# Patient Record
Sex: Female | Born: 1965 | Race: White | Hispanic: No | Marital: Single | Smoking: Former smoker
Health system: Southern US, Community
[De-identification: ages and names within clinical notes are randomized; demographics above are authoritative.]

## PROBLEM LIST (undated history)

## (undated) DIAGNOSIS — F32A Depression, unspecified: Secondary | ICD-10-CM

## (undated) DIAGNOSIS — E785 Hyperlipidemia, unspecified: Secondary | ICD-10-CM

## (undated) DIAGNOSIS — K219 Gastro-esophageal reflux disease without esophagitis: Secondary | ICD-10-CM

## (undated) DIAGNOSIS — I10 Essential (primary) hypertension: Secondary | ICD-10-CM

## (undated) DIAGNOSIS — F329 Major depressive disorder, single episode, unspecified: Secondary | ICD-10-CM

## (undated) DIAGNOSIS — E119 Type 2 diabetes mellitus without complications: Secondary | ICD-10-CM

## (undated) DIAGNOSIS — G4730 Sleep apnea, unspecified: Secondary | ICD-10-CM

## (undated) DIAGNOSIS — F419 Anxiety disorder, unspecified: Secondary | ICD-10-CM

## (undated) DIAGNOSIS — N3040 Irradiation cystitis without hematuria: Secondary | ICD-10-CM

## (undated) HISTORY — DX: Gastro-esophageal reflux disease without esophagitis: K21.9

## (undated) HISTORY — DX: Major depressive disorder, single episode, unspecified: F32.9

## (undated) HISTORY — PX: BACK SURGERY: SHX140

## (undated) HISTORY — DX: Anxiety disorder, unspecified: F41.9

## (undated) HISTORY — PX: GALLBLADDER SURGERY: SHX652

## (undated) HISTORY — PX: APPENDECTOMY: SHX54

## (undated) HISTORY — DX: Type 2 diabetes mellitus without complications: E11.9

## (undated) HISTORY — DX: Irradiation cystitis without hematuria: N30.40

## (undated) HISTORY — DX: Hyperlipidemia, unspecified: E78.5

## (undated) HISTORY — PX: UMBILICAL HERNIA REPAIR: SHX196

## (undated) HISTORY — DX: Depression, unspecified: F32.A

## (undated) HISTORY — DX: Sleep apnea, unspecified: G47.30

## (undated) HISTORY — DX: Essential (primary) hypertension: I10

---

## 2006-04-05 ENCOUNTER — Ambulatory Visit (HOSPITAL_COMMUNITY): Admission: RE | Admit: 2006-04-05 | Discharge: 2006-04-05 | Payer: Self-pay | Admitting: Obstetrics & Gynecology

## 2006-05-17 ENCOUNTER — Ambulatory Visit: Admission: RE | Admit: 2006-05-17 | Discharge: 2006-05-17 | Payer: Self-pay | Admitting: Internal Medicine

## 2006-05-18 ENCOUNTER — Ambulatory Visit (HOSPITAL_COMMUNITY): Admission: RE | Admit: 2006-05-18 | Discharge: 2006-05-18 | Payer: Self-pay | Admitting: Internal Medicine

## 2006-05-20 ENCOUNTER — Inpatient Hospital Stay (HOSPITAL_COMMUNITY): Admission: AD | Admit: 2006-05-20 | Discharge: 2006-05-28 | Payer: Self-pay | Admitting: Otolaryngology

## 2006-05-25 ENCOUNTER — Ambulatory Visit: Payer: Self-pay | Admitting: Pulmonary Disease

## 2006-05-26 ENCOUNTER — Ambulatory Visit: Payer: Self-pay | Admitting: Internal Medicine

## 2006-08-27 ENCOUNTER — Ambulatory Visit (HOSPITAL_COMMUNITY): Admission: RE | Admit: 2006-08-27 | Discharge: 2006-08-27 | Payer: Self-pay | Admitting: *Deleted

## 2006-08-31 ENCOUNTER — Ambulatory Visit (HOSPITAL_COMMUNITY): Admission: RE | Admit: 2006-08-31 | Discharge: 2006-08-31 | Payer: Self-pay | Admitting: *Deleted

## 2006-09-28 ENCOUNTER — Ambulatory Visit (HOSPITAL_COMMUNITY): Admission: RE | Admit: 2006-09-28 | Discharge: 2006-09-28 | Payer: Self-pay | Admitting: Family Medicine

## 2006-10-11 ENCOUNTER — Ambulatory Visit (HOSPITAL_COMMUNITY): Admission: RE | Admit: 2006-10-11 | Discharge: 2006-10-11 | Payer: Self-pay | Admitting: Internal Medicine

## 2006-10-11 ENCOUNTER — Ambulatory Visit: Payer: Self-pay | Admitting: Internal Medicine

## 2010-09-02 NOTE — Cardiovascular Report (Signed)
NAME:  Alyssa Gay, Alyssa Gay               ACCOUNT NO.:  [id number]   MEDICAL RECORD NO.:  [id number]          PATIENT TYPE:  OIB   LOCATION:  2856                         FACILITY:  [facility]   PHYSICIAN:  Darlin Priestly, MD  DATE OF BIRTH:  12-Sep-1965   DATE OF PROCEDURE:  08/31/2006  DATE OF DISCHARGE:                            CARDIAC CATHETERIZATION   PROCEDURES:  1. Right heart catheterization.  2. Left heart catheterization.  3. Coronary angiography.  4. Left ventriculogram.  5. Abdominal aortogram.   COMPLICATIONS:  None.   INDICATIONS:  Ms. Alyssa Gay is a 45 year old female patient of Dr. Catalina Pizza and Dr. Kem Boroughs with a history of gastroesophageal reflux  disease, depression, history of recent hospital admission for MRSA  infection of the left face who has continued to complain of chest pain  and shortness of breath.  She has had negative Cardiolites as well as  ultrasounds revealing only mild MR. and mild TR.  Because of her ongoing  symptoms, she is now referred for cardiac catheterization to rule out  significant CAD.   DESCRIPTION OF OPERATION:  After obtaining informed consent, the patient  was brought to the cardiac cath lab.  The right groin was shaved,  prepped and draped in a sterile fashion.  EEG to monitor status.  Using  modified Seldinger technique, a #7-French venous sheath using the right  femoral vein and a #6-French arterial sheath using the right femoral  artery.  Next, under fluoroscopic guidance, a #7-French Swan-Ganz  catheter was floated into the RA, RV, PA and wedge position and  hemodynamics were obtained.   A #6-French diagnostic catheter was used to perform diagnostic  angiography.   The left main is a large vessel which has no significant disease.   The LAD is a medium-size vessel which becomes a small vessel toward the  apex to one small diagonal branch.  The LAD has no significant disease.   The first diagonal is a small vessel with  no significant disease.   The left coronary artery gives rise of a medium to large size ramus  intermedius which bifurcates distally with no significant disease.   Left circumflex is a medium-size vessel coursing in the AV groove and  gives rise to one obtuse marginal branch.  The AV circumflex has no  significant disease.   The first OM is a medium-size vessel which bifurcates distally with no  significant disease.   The right coronary is a large vessel which is dominant consists of a PDA  as well as posterolateral branch.  There is mild 30% ostial narrowing,  but no further significant disease in the RCA, PDA or posterolateral  branch.   Left ventriculogram reveals low normal ejection fraction of 50%.   Abdominal aortogram reveals no significant renal artery stenosis with  normal distal aorta.   HEMODYNAMICS:  Right arterial pressure 10, RV 34/7, PA 34/15, pulmonary  capillary pressures are 15, systemic arterial pressure 115/71, LV  systolic pressure 117/10, LVEDP of 17, cardiac output 6.9, cardiac index  3.3, PA saturation 61%, AO saturation 95%.   CONCLUSIONS:  1. No significant coronary artery disease.  2. Low normal ejection fraction.  3. No obvious renal artery stenosis.  4. Mild pulmonary hypertension.  5. Cardiac output of 6.9, cardiac index 3.3.  6. PA saturation 61%, AO saturation 95%.      Darlin Priestly, MD     RHM/MEDQ  D:  08/31/2006  T:  08/31/2006  Job:  [id number]   cc:   Dani Gobble, MD  Catalina Pizza, M.D.

## 2010-09-02 NOTE — Op Note (Signed)
NAME:  Alyssa Gay, Alyssa Gay               ACCOUNT NO.:  [id number]   MEDICAL RECORD NO.:  [id number]          PATIENT TYPE:  AMB   LOCATION:  DAY                           FACILITY:  [facility]   PHYSICIAN:  R. Roetta Sessions, M.D. DATE OF BIRTH:  03-03-1966   DATE OF PROCEDURE:  10/11/2006  DATE OF DISCHARGE:                               OPERATIVE REPORT   PROCEDURE:  Colonoscopy with snare polypectomy.   INDICATIONS FOR PROCEDURE:  45 year old lady with intermittent rectal  bleeding.  She denies constipation, diarrhea.  She denies any pain  associated with her symptoms. Father has history of bleeding colonic  polyps.  She seen Dr. Lazaro Arms recently and is not have felt be  GYN issue.  She has never had her lower GI tract imaged.  Colonoscopy is  now being done.  This approach has discussed the patient at length.  Potential risks, benefits, alternatives have been reviewed questions  answered, she is agreeable.  Please see documentation medical record.   PROCEDURE NOTE:  O2 saturation, blood pressure, pulse, respirations  monitored throughout entire procedure.   CONSCIOUS SEDATION:  Phenergan 12.5 mg diluted slow IV push to augment  conscious sedation IV Versed, Demerol incremental doses.   INSTRUMENT:  Pentax video chip system.   Digital rectal exam revealed some external hemorrhoidal tags, otherwise  negative.   ENDOSCOPIC FINDINGS:  Prep was suboptimal but doable.  Colon:  Colonic mucosa was surveyed from the rectosigmoid junction  through the left transverse right colon to area appendiceal orifice,  ileocecal valve and cecum.  These structures well seen photographed for  the record.  From this level scope was slowly withdrawn.  All previous  mentioned mucosal surfaces were again seen.  The patient had a flat 5-mm  polyp in the base of the cecum which was cold snared completely removed  with two passes.  The patient had a few scattered left-sided  diverticula.  Remainder colonic  mucosa appeared normal.  The scope was  pulled down to the rectum.  Thorough examination  rectal mucosa  including retroflexion view the anal verge demonstrated only some anal  canal hemorrhoids.  The patient tolerated the procedure well was reacted  in endoscopy.   IMPRESSION:  Anal canal hemorrhoids, otherwise normal rectum, scattered  left-sided diverticula, flat polyp in the base of cecum, cold snare  removed.  Remainder of colonic mucosa appeared normal.  This patient has  most likely bled from her anorectum via benign disease.   RECOMMENDATIONS:  Hemorrhoid and diverticulosis literature provided [name], a 10-day course of Anusol HC suppositories one per rectum  bedtime, daily Metamucil fiber supplement.  Follow-up on path (polyp  recovery process at time of dictation).  Further recommendations to  follow.      Jonathon Bellows, M.D.  Electronically Signed     RMR/MEDQ  D:  10/11/2006  T:  10/12/2006  Job:  [id number]   cc:   Angus G. Renard Matter, MD  Fax: [phone]   Lazaro Arms, M.D.  Fax: [phone]

## 2010-09-05 NOTE — Consult Note (Signed)
NAME:  Alyssa Gay, Alyssa Gay               ACCOUNT NO.:  [id number]   MEDICAL RECORD NO.:  [id number]          PATIENT TYPE:  INP   LOCATION:  6713                         FACILITY:  [facility]   PHYSICIAN:  Theresia Bough, MD       DATE OF BIRTH:  10/11/1965   DATE OF CONSULTATION:  05/22/2006  DATE OF DISCHARGE:                                 CONSULTATION   REQUESTING PHYSICIAN:  Lucky Cowboy, MD.   REASON FOR CONSULTATION:  Dr. Gerilyn Pilgrim has requested Family Practice to see  the patient.  Family Practice called [facility] to help them to see the  patient.  Reason for consult is to optimize patient's medical  management.   PRESENTING COMPLAINT:  Left facial pain and abscess.   HISTORY OF PRESENTING ILLNESS:  This is a 45 year old white female  patient who developed left facial pain and abscess.  She went to see Dr.  Gerilyn Pilgrim at [facility].  She was then admitted for  incision and drainage of the left facial abscess.  She was admitted to  the floor after the I&D.  Patient has been on the floor receiving pain  medications as well as antibiotics.  Patient said her pain is 7/10.  The  pain is relieved by morphine.  Patient denies any history of trauma.  She said that she first developed a skin pustule near the left side of  the mouth, which later developed to become an abscess.  She denies any  fever.  No nausea, no vomiting, no diarrhea.  No chest pain.  No cough.  No shortness of breath.  No abdominal pain.  She denies any dysuria.  No  leg swelling.  No feet swelling and no joint pains.   PAST MEDICAL HISTORY:  History of high cholesterol.  Patient takes  Vytoren.  Patient also told me that she had chest pain last week and she  went to see Dr. Domingo Sep.  She says she had a stress test done, but the  report is pending.  She also had a Holter placed on her.  The Holter was  to stay for 24 hours.  She disconnected the Holter herself while in the  hospital after 24 hours.  She returned  the Holter to Dr. Roque Lias  office.  She will follow up with Holter report and stress test results  after her discharge.  Other past medical history includes a history of  anxiety.  She takes Xanax for that.   SOCIAL HISTORY:  She used to be a smoker.  At this time she takes  Chantix.  She would like to stop smoking.  She lives with her boyfriend  and his two children.   ALLERGIES:  SHE HAS NO KNOWN DRUG ALLERGIES.   FAMILY HISTORY:  Family history is noncontributory.   CURRENT MEDICATIONS:  Includes:  1. Ciprofloxacin antibiotic.  2. She is on Xanax 4.5 mg nightly.  3. She is on Cymbalta 20 mg daily.  4. She is on Vytorin 1 tablet daily.  5. Protonix 40 mg daily.  6. She is on vancomycin 1 g  q.12 h.  7. She is on Percocet for pain.  8. She is also on morphine for pain.   PHYSICAL EXAMINATION:  VITAL SIGNS:  On physical examination,  temperature at 6:30 this morning was 100.1, pulse rate is 93,  respiratory rate 20, blood pressure is 112/65, oxygen saturation is 95%  on room air.  HEAD/NECK:  Shows pink conjunctivae.  She has swelling around the left  eye.  She also has swelling around the left cheek with redness.  The  left cheek is tender.  Because of the swelling, she has mouth slightly  deviated to the right side.  CHEST:  Chest wall motion to respiration.  Auscultation shows clear  breath sounds.  CARDIOVASCULAR:  Shows normal heart sounds.  No murmur.  No gallop.  ABDOMEN:  Abdomen is soft and nontender.  No masses palpable.  Patient  has normal bowel sounds.  EXTREMITIES:  Show no edema.  NEUROLOGIC SYSTEM:  Alert and oriented to time, place and person.  Her  speech is clear.  Power is 4/4 in all limbs.  Gait is normal.  Sensation  is also normal.   LABORATORY DATA:  Chest x-ray done this morning shows no acute disease.  Patient also has an abscess culture which is showing gram-positive cocci  in clusters.  Antibiotic sensitivity is pending.   ASSESSMENT:  1.  Cellulitis and left facial abscess.  Patient is on IV vancomycin 1      g q.12 h., which I will continue.  I will also follow up with the      sensitivity.  I will also follow up with antibiotic sensitivity as      well as organism identification.  We will modify her antibiotics      once the sensitivity is available.  2. History of high cholesterol.  I will continue Vytorin.  3. Also patient mentioned a history of chest pain before she came to      the hospital.  She does not have any chest pain at this time.  She      has done a stress test with Dr. Domingo Sep which she will follow up      as an outpatient.      Theresia Bough, MD  Electronically Signed     GA/MEDQ  D:  05/22/2006  T:  05/23/2006  Job:  [id number]

## 2010-09-05 NOTE — Op Note (Signed)
Alyssa Gay, Alyssa Gay               ACCOUNT NO.:  [id number]   MEDICAL RECORD NO.:  [id number]          PATIENT TYPE:  INP   LOCATION:  6713                         FACILITY:  [facility]   PHYSICIAN:  Lucky Cowboy, MD         DATE OF BIRTH:  Nov 19, 1965   DATE OF PROCEDURE:  05/21/2006  DATE OF DISCHARGE:                               OPERATIVE REPORT   PREOPERATIVE DIAGNOSIS:  Left facial abscess.   POSTOPERATIVE DIAGNOSIS:  Left facial abscess.   PROCEDURE:  Incision and drainage of left facial abscess.   SURGEON:  Lucky Cowboy, M.D.   ANESTHESIA:  General endotracheal anesthesia.   ESTIMATED BLOOD LOSS:  Less than 20 mL.   SPECIMENS:  Culture from abscess cavity.   COMPLICATIONS:  None.   INDICATIONS:  The patient is a 45 year old female with a two-week  history of progressive left facial swelling.  She reports that this  began as a very small pustule along the skin portion of the left upper  lateral lip.  Once the skin pustule began resolving, the left cheek over  the zygoma began swelling and became more and more painful.  There is  pain around the eye.  She was evaluated in the office yesterday and  noted to have an approximately 4-cm probable abscess pocket over the  left zygoma.  She did have a CT scan two days prior which indicated a  very small abscess cavity.  Due to progression despite antibiotic  therapy which has included Levaquin and clindamycin, the patient is  taken to the operating room for incision and drainage after admission  last night and placement on IV vancomycin.   FINDINGS:  The patient was noted to have approximately 5 to 7 mL of pus  in a left lateral facial abscess with maximum prominence over the  zygoma.  It was sent for culture and sensitivity, both aerobic and  anaerobic.   PROCEDURE:  The patient was taken to the operating room and placed on  the table in the supine position.  She was then placed under general  endotracheal anesthesia and the  left sublabial and gingival mucosa  injected with 1% lidocaine with 1:100,000 of epinephrine.  After  allowing time for vasoconstrictive effect, Bovie cautery was used in the  cutting mode to divide the mucosa and in the coagulation mode to divide  the underlying facial musculature.  A mosquito was then used to enter  the abscess cavity.  This was extended using a hemostat.  Pus was  evacuated promptly and cultures obtained.  The area was copiously  irrigated with normal saline.  A quarter-inch Penrose drain was placed  into the depths of the wound and secured to the gingiva in simple  interrupted fashion using 4-0 Vicryl.  The medial portion of the  incision was reapproximated in a simple interrupted fashion using 4-0  Vicryl.   The patient was awakened from anesthesia and taken to the postanesthesia  care unit in stable condition.  There were no complications.      Lucky Cowboy, MD  Electronically Signed  SJ/MEDQ  D:  05/21/2006  T:  05/21/2006  Job:  [id number]   cc:   Ladora Daniel, Nose, and Throat  Angus G. Renard Matter, MD

## 2010-09-05 NOTE — Procedures (Signed)
NAME:  Alyssa Gay, Alyssa Gay               ACCOUNT NO.:  [id number]   MEDICAL RECORD NO.:  [id number]          PATIENT TYPE:  INP   LOCATION:  SLEEP LAB                    FACILITY:  [facility]   PHYSICIAN:  Barbaraann Share, MD,FCCPDATE OF BIRTH:  1965-07-23   DATE OF STUDY:  05/20/2006                            NOCTURNAL POLYSOMNOGRAM   .   PLAN:   REFERRING PHYSICIAN:  Dr. Catalina Pizza.   DATE OF STUDY:  May 17, 2006.   INDICATION FOR STUDY:  Hypersomnia with sleep apnea.   EPWORTH SCORE:  16.   SLEEP ARCHITECTURE:  The patient had total sleep time of 330 minutes  with decreased slow wave sleep for her age and decreased REM as well at  20 minutes.  Sleep onset latency was prolonged at 44 minutes and REM  onset latency was prolonged at 210 minutes.  Sleep efficiency was  decreased at 85%.   RESPIRATORY DATA:  The patient was found to have 6 hypopneas and 1  central apnea for a respiratory disturbance index of 1.3 events per  hour.  Obviously, she did not meet split night protocol.  It should be  noted the patient had very little REM and therefore her degree of sleep  apnea may be underestimated.  The patient did have loud to very loud  snoring with very large numbers of nonspecific arousals.  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   OXYGEN DATA:  There was O2 desaturation as low as 90% during the study.   CARDIAC DATA:  No clinically significant cardiac arrhythmias.   MOVEMENT-PARASOMNIA:  The patient was found to have 46 leg jerks with 2  per hour resulting in arousal or awakening.   IMPRESSION-RECOMMENDATIONS:  1. Small numbers of obstructive events which do not meet the RDI      criteria for the obstructive sleep apnea syndrome.  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  This      is a pre sleep apnea condition that can often disrupt sleep and      cause symptoms during the day.  Clinical correlation is suggested.      The patient did not meet split night protocol secondary to her      small numbers of events.  2. Small numbers of leg jerks with mild sleep disruption.  This may      simply be due to the patient's sleep disordered breathing noted      above.  However, clinical correlation is suggested to make sure      there is not evidence for a primary movement disorder of sleep.      Barbaraann Share, MD,FCCP  Diplomate, American Board of Sleep  Medicine  Electronically Signed     KMC/MEDQ  D:  05/26/2006 15:03:02  T:  05/26/2006 22:37:09  Job:  [id number]

## 2010-09-05 NOTE — Discharge Summary (Signed)
NAMETALAYLA, DOYEL               ACCOUNT NO.:  [id number]   MEDICAL RECORD NO.:  [id number]          PATIENT TYPE:  INP   LOCATION:  6713                         FACILITY:  [facility]   PHYSICIAN:  Lucky Cowboy, MD         DATE OF BIRTH:  May 22, 1965   DATE OF ADMISSION:  05/20/2006  DATE OF DISCHARGE:  05/28/2006                               DISCHARGE SUMMARY   DISCHARGE DIAGNOSES:  1. Left methicillin-resistant staphylococcus aureus cheek abscess.  2. Hypercholesterolemia.   PROCEDURE PERFORMED:  Incision and drainage of left facial abscess.   HISTORY:  This patient is a 45 year old female who was seen yesterday in  the office.  She was noted to have a very swollen and sensitive left  face with erythema.  She was brought into the hospital for intravenous  antibiotic fluids and drainage of a left cheek abscess.  She was  admitted for intravenous vancomycin.  She was treated with Hibiclens  skin cleansing to prevent transmission of MRSA.  The surgery was  performed the following morning and did evacuate some abscess from a  large left cheek cavity.  Cultures were obtained and were reported back  as consistent with methicillin-resistant staphylococcus aureus.  Infectious Disease did see the patient which agreed with her diagnosis  and the fact that she was responding well to incision and drainage as  well as vancomycin.  Pharmacy regulated the vancomycin to appropriate  levels.  Percutaneous intravenous catheter was placed without difficulty  for home intravenous therapy which was arranged through [facility] home  care.  The patient did have some concerns with regard to pain  management.  This was somewhat difficult to control.  While in the  hospital, she did take Chantix for smoking cessation.  She was evaluated  for chest pain by [facility] and found to be without cardiac  etiology.  For these reasons, the patient was discharged to home on  May 28, 2006.  She has a follow-up in  the office in two days.      Lucky Cowboy, MD  Electronically Signed     SJ/MEDQ  D:  07/30/2006  T:  07/31/2006  Job:  [id number]

## 2011-09-18 ENCOUNTER — Encounter: Payer: Self-pay | Admitting: Internal Medicine

## 2015-08-01 DIAGNOSIS — G4700 Insomnia, unspecified: Secondary | ICD-10-CM | POA: Diagnosis not present

## 2015-08-01 DIAGNOSIS — R69 Illness, unspecified: Secondary | ICD-10-CM | POA: Diagnosis not present

## 2015-08-01 DIAGNOSIS — E1165 Type 2 diabetes mellitus with hyperglycemia: Secondary | ICD-10-CM | POA: Diagnosis not present

## 2015-08-01 DIAGNOSIS — R1084 Generalized abdominal pain: Secondary | ICD-10-CM | POA: Diagnosis not present

## 2015-09-10 DIAGNOSIS — R1030 Lower abdominal pain, unspecified: Secondary | ICD-10-CM | POA: Diagnosis not present

## 2015-09-10 DIAGNOSIS — R0789 Other chest pain: Secondary | ICD-10-CM | POA: Diagnosis not present

## 2015-09-10 DIAGNOSIS — Z5321 Procedure and treatment not carried out due to patient leaving prior to being seen by health care provider: Secondary | ICD-10-CM | POA: Diagnosis not present

## 2015-09-10 DIAGNOSIS — R1032 Left lower quadrant pain: Secondary | ICD-10-CM | POA: Diagnosis not present

## 2015-09-10 DIAGNOSIS — E785 Hyperlipidemia, unspecified: Secondary | ICD-10-CM | POA: Diagnosis not present

## 2015-09-10 DIAGNOSIS — E119 Type 2 diabetes mellitus without complications: Secondary | ICD-10-CM | POA: Diagnosis not present

## 2015-09-10 DIAGNOSIS — Z8543 Personal history of malignant neoplasm of ovary: Secondary | ICD-10-CM | POA: Diagnosis not present

## 2015-09-10 DIAGNOSIS — R69 Illness, unspecified: Secondary | ICD-10-CM | POA: Diagnosis not present

## 2015-09-10 DIAGNOSIS — Z79891 Long term (current) use of opiate analgesic: Secondary | ICD-10-CM | POA: Diagnosis not present

## 2015-09-10 DIAGNOSIS — Z7902 Long term (current) use of antithrombotics/antiplatelets: Secondary | ICD-10-CM | POA: Diagnosis not present

## 2015-09-10 DIAGNOSIS — Y9389 Activity, other specified: Secondary | ICD-10-CM | POA: Diagnosis not present

## 2015-09-10 DIAGNOSIS — R079 Chest pain, unspecified: Secondary | ICD-10-CM | POA: Diagnosis not present

## 2015-09-10 DIAGNOSIS — R238 Other skin changes: Secondary | ICD-10-CM | POA: Diagnosis not present

## 2015-09-10 DIAGNOSIS — S30821A Blister (nonthermal) of abdominal wall, initial encounter: Secondary | ICD-10-CM | POA: Diagnosis not present

## 2015-09-12 DIAGNOSIS — K439 Ventral hernia without obstruction or gangrene: Secondary | ICD-10-CM | POA: Diagnosis not present

## 2015-09-12 DIAGNOSIS — Y831 Surgical operation with implant of artificial internal device as the cause of abnormal reaction of the patient, or of later complication, without mention of misadventure at the time of the procedure: Secondary | ICD-10-CM | POA: Diagnosis not present

## 2015-09-12 DIAGNOSIS — T8579XA Infection and inflammatory reaction due to other internal prosthetic devices, implants and grafts, initial encounter: Secondary | ICD-10-CM | POA: Diagnosis not present

## 2015-09-12 DIAGNOSIS — R69 Illness, unspecified: Secondary | ICD-10-CM | POA: Diagnosis not present

## 2015-09-12 DIAGNOSIS — R1032 Left lower quadrant pain: Secondary | ICD-10-CM | POA: Diagnosis not present

## 2015-09-12 DIAGNOSIS — R233 Spontaneous ecchymoses: Secondary | ICD-10-CM | POA: Diagnosis not present

## 2015-09-12 DIAGNOSIS — R112 Nausea with vomiting, unspecified: Secondary | ICD-10-CM | POA: Diagnosis not present

## 2015-09-12 DIAGNOSIS — D72829 Elevated white blood cell count, unspecified: Secondary | ICD-10-CM | POA: Diagnosis not present

## 2015-09-12 DIAGNOSIS — Z7984 Long term (current) use of oral hypoglycemic drugs: Secondary | ICD-10-CM | POA: Diagnosis not present

## 2015-09-12 DIAGNOSIS — T814XXA Infection following a procedure, initial encounter: Secondary | ICD-10-CM | POA: Diagnosis not present

## 2015-09-13 DIAGNOSIS — D72829 Elevated white blood cell count, unspecified: Secondary | ICD-10-CM | POA: Diagnosis not present

## 2015-09-13 DIAGNOSIS — R1084 Generalized abdominal pain: Secondary | ICD-10-CM | POA: Diagnosis not present

## 2015-09-13 DIAGNOSIS — R69 Illness, unspecified: Secondary | ICD-10-CM | POA: Diagnosis not present

## 2015-09-13 DIAGNOSIS — T8579XA Infection and inflammatory reaction due to other internal prosthetic devices, implants and grafts, initial encounter: Secondary | ICD-10-CM | POA: Diagnosis not present

## 2015-09-13 DIAGNOSIS — Y831 Surgical operation with implant of artificial internal device as the cause of abnormal reaction of the patient, or of later complication, without mention of misadventure at the time of the procedure: Secondary | ICD-10-CM | POA: Diagnosis not present

## 2015-09-13 DIAGNOSIS — Z7984 Long term (current) use of oral hypoglycemic drugs: Secondary | ICD-10-CM | POA: Diagnosis not present

## 2015-09-25 DIAGNOSIS — K432 Incisional hernia without obstruction or gangrene: Secondary | ICD-10-CM | POA: Diagnosis not present

## 2015-09-25 DIAGNOSIS — M25473 Effusion, unspecified ankle: Secondary | ICD-10-CM | POA: Diagnosis not present

## 2015-10-07 DIAGNOSIS — M25473 Effusion, unspecified ankle: Secondary | ICD-10-CM | POA: Diagnosis not present

## 2015-10-07 DIAGNOSIS — Y838 Other surgical procedures as the cause of abnormal reaction of the patient, or of later complication, without mention of misadventure at the time of the procedure: Secondary | ICD-10-CM | POA: Diagnosis not present

## 2015-10-07 DIAGNOSIS — I340 Nonrheumatic mitral (valve) insufficiency: Secondary | ICD-10-CM | POA: Diagnosis not present

## 2015-10-07 DIAGNOSIS — Z79899 Other long term (current) drug therapy: Secondary | ICD-10-CM | POA: Diagnosis not present

## 2015-10-07 DIAGNOSIS — E119 Type 2 diabetes mellitus without complications: Secondary | ICD-10-CM | POA: Diagnosis not present

## 2015-10-07 DIAGNOSIS — R69 Illness, unspecified: Secondary | ICD-10-CM | POA: Diagnosis not present

## 2015-10-07 DIAGNOSIS — E785 Hyperlipidemia, unspecified: Secondary | ICD-10-CM | POA: Diagnosis not present

## 2015-10-07 DIAGNOSIS — G2581 Restless legs syndrome: Secondary | ICD-10-CM | POA: Diagnosis not present

## 2015-10-07 DIAGNOSIS — T8579XA Infection and inflammatory reaction due to other internal prosthetic devices, implants and grafts, initial encounter: Secondary | ICD-10-CM | POA: Diagnosis not present

## 2015-10-15 DIAGNOSIS — G2581 Restless legs syndrome: Secondary | ICD-10-CM | POA: Diagnosis not present

## 2015-10-15 DIAGNOSIS — G8918 Other acute postprocedural pain: Secondary | ICD-10-CM | POA: Diagnosis not present

## 2015-10-15 DIAGNOSIS — T8579XA Infection and inflammatory reaction due to other internal prosthetic devices, implants and grafts, initial encounter: Secondary | ICD-10-CM | POA: Diagnosis not present

## 2015-10-15 DIAGNOSIS — R109 Unspecified abdominal pain: Secondary | ICD-10-CM | POA: Diagnosis not present

## 2015-10-15 DIAGNOSIS — E785 Hyperlipidemia, unspecified: Secondary | ICD-10-CM | POA: Diagnosis not present

## 2015-10-15 DIAGNOSIS — R69 Illness, unspecified: Secondary | ICD-10-CM | POA: Diagnosis not present

## 2015-10-15 DIAGNOSIS — E119 Type 2 diabetes mellitus without complications: Secondary | ICD-10-CM | POA: Diagnosis not present

## 2015-10-15 DIAGNOSIS — K432 Incisional hernia without obstruction or gangrene: Secondary | ICD-10-CM | POA: Diagnosis not present

## 2015-10-15 DIAGNOSIS — R339 Retention of urine, unspecified: Secondary | ICD-10-CM | POA: Diagnosis not present

## 2015-10-15 DIAGNOSIS — G8929 Other chronic pain: Secondary | ICD-10-CM | POA: Diagnosis not present

## 2015-10-30 DIAGNOSIS — Z09 Encounter for follow-up examination after completed treatment for conditions other than malignant neoplasm: Secondary | ICD-10-CM | POA: Diagnosis not present

## 2015-11-06 DIAGNOSIS — Y838 Other surgical procedures as the cause of abnormal reaction of the patient, or of later complication, without mention of misadventure at the time of the procedure: Secondary | ICD-10-CM | POA: Diagnosis not present

## 2015-11-06 DIAGNOSIS — T814XXD Infection following a procedure, subsequent encounter: Secondary | ICD-10-CM | POA: Diagnosis not present

## 2015-11-12 DIAGNOSIS — R69 Illness, unspecified: Secondary | ICD-10-CM | POA: Diagnosis not present

## 2015-11-12 DIAGNOSIS — R600 Localized edema: Secondary | ICD-10-CM | POA: Diagnosis not present

## 2015-11-12 DIAGNOSIS — E1165 Type 2 diabetes mellitus with hyperglycemia: Secondary | ICD-10-CM | POA: Diagnosis not present

## 2015-11-12 DIAGNOSIS — R1084 Generalized abdominal pain: Secondary | ICD-10-CM | POA: Diagnosis not present

## 2015-11-20 DIAGNOSIS — L539 Erythematous condition, unspecified: Secondary | ICD-10-CM | POA: Diagnosis not present

## 2015-11-20 DIAGNOSIS — R188 Other ascites: Secondary | ICD-10-CM | POA: Diagnosis not present

## 2015-11-22 DIAGNOSIS — L03311 Cellulitis of abdominal wall: Secondary | ICD-10-CM | POA: Diagnosis not present

## 2015-11-22 DIAGNOSIS — E119 Type 2 diabetes mellitus without complications: Secondary | ICD-10-CM | POA: Diagnosis not present

## 2015-11-22 DIAGNOSIS — E785 Hyperlipidemia, unspecified: Secondary | ICD-10-CM | POA: Diagnosis not present

## 2015-11-22 DIAGNOSIS — R69 Illness, unspecified: Secondary | ICD-10-CM | POA: Diagnosis not present

## 2015-11-22 DIAGNOSIS — R109 Unspecified abdominal pain: Secondary | ICD-10-CM | POA: Diagnosis not present

## 2015-11-22 DIAGNOSIS — Y832 Surgical operation with anastomosis, bypass or graft as the cause of abnormal reaction of the patient, or of later complication, without mention of misadventure at the time of the procedure: Secondary | ICD-10-CM | POA: Diagnosis not present

## 2015-11-22 DIAGNOSIS — Z90710 Acquired absence of both cervix and uterus: Secondary | ICD-10-CM | POA: Diagnosis not present

## 2015-11-22 DIAGNOSIS — G2581 Restless legs syndrome: Secondary | ICD-10-CM | POA: Diagnosis not present

## 2015-11-22 DIAGNOSIS — T8579XA Infection and inflammatory reaction due to other internal prosthetic devices, implants and grafts, initial encounter: Secondary | ICD-10-CM | POA: Diagnosis not present

## 2015-12-12 DIAGNOSIS — Z803 Family history of malignant neoplasm of breast: Secondary | ICD-10-CM | POA: Diagnosis not present

## 2015-12-12 DIAGNOSIS — L680 Hirsutism: Secondary | ICD-10-CM | POA: Diagnosis not present

## 2015-12-12 DIAGNOSIS — R110 Nausea: Secondary | ICD-10-CM | POA: Diagnosis not present

## 2015-12-12 DIAGNOSIS — E8940 Asymptomatic postprocedural ovarian failure: Secondary | ICD-10-CM | POA: Diagnosis not present

## 2015-12-18 DIAGNOSIS — R229 Localized swelling, mass and lump, unspecified: Secondary | ICD-10-CM | POA: Diagnosis not present

## 2015-12-18 DIAGNOSIS — N6459 Other signs and symptoms in breast: Secondary | ICD-10-CM | POA: Diagnosis not present

## 2015-12-18 DIAGNOSIS — R2231 Localized swelling, mass and lump, right upper limb: Secondary | ICD-10-CM | POA: Diagnosis not present

## 2015-12-18 DIAGNOSIS — N63 Unspecified lump in breast: Secondary | ICD-10-CM | POA: Diagnosis not present

## 2015-12-25 DIAGNOSIS — Z79899 Other long term (current) drug therapy: Secondary | ICD-10-CM | POA: Diagnosis not present

## 2015-12-25 DIAGNOSIS — E785 Hyperlipidemia, unspecified: Secondary | ICD-10-CM | POA: Diagnosis not present

## 2015-12-25 DIAGNOSIS — T8579XA Infection and inflammatory reaction due to other internal prosthetic devices, implants and grafts, initial encounter: Secondary | ICD-10-CM | POA: Diagnosis not present

## 2015-12-25 DIAGNOSIS — Y33XXXA Other specified events, undetermined intent, initial encounter: Secondary | ICD-10-CM | POA: Diagnosis not present

## 2015-12-25 DIAGNOSIS — T8189XA Other complications of procedures, not elsewhere classified, initial encounter: Secondary | ICD-10-CM | POA: Diagnosis not present

## 2015-12-25 DIAGNOSIS — E119 Type 2 diabetes mellitus without complications: Secondary | ICD-10-CM | POA: Diagnosis not present

## 2015-12-25 DIAGNOSIS — T814XXA Infection following a procedure, initial encounter: Secondary | ICD-10-CM | POA: Diagnosis not present

## 2015-12-25 DIAGNOSIS — G2581 Restless legs syndrome: Secondary | ICD-10-CM | POA: Diagnosis not present

## 2015-12-25 DIAGNOSIS — Y838 Other surgical procedures as the cause of abnormal reaction of the patient, or of later complication, without mention of misadventure at the time of the procedure: Secondary | ICD-10-CM | POA: Diagnosis not present

## 2015-12-25 DIAGNOSIS — L7634 Postprocedural seroma of skin and subcutaneous tissue following other procedure: Secondary | ICD-10-CM | POA: Diagnosis not present

## 2015-12-25 DIAGNOSIS — Z7984 Long term (current) use of oral hypoglycemic drugs: Secondary | ICD-10-CM | POA: Diagnosis not present

## 2015-12-26 DIAGNOSIS — L7632 Postprocedural hematoma of skin and subcutaneous tissue following other procedure: Secondary | ICD-10-CM | POA: Diagnosis not present

## 2015-12-26 DIAGNOSIS — B9561 Methicillin susceptible Staphylococcus aureus infection as the cause of diseases classified elsewhere: Secondary | ICD-10-CM | POA: Diagnosis not present

## 2015-12-26 DIAGNOSIS — B957 Other staphylococcus as the cause of diseases classified elsewhere: Secondary | ICD-10-CM | POA: Diagnosis not present

## 2015-12-26 DIAGNOSIS — Z01818 Encounter for other preprocedural examination: Secondary | ICD-10-CM | POA: Diagnosis not present

## 2015-12-26 DIAGNOSIS — T8189XA Other complications of procedures, not elsewhere classified, initial encounter: Secondary | ICD-10-CM | POA: Diagnosis not present

## 2015-12-27 DIAGNOSIS — T8189XA Other complications of procedures, not elsewhere classified, initial encounter: Secondary | ICD-10-CM | POA: Diagnosis not present

## 2015-12-30 DIAGNOSIS — Z7984 Long term (current) use of oral hypoglycemic drugs: Secondary | ICD-10-CM | POA: Diagnosis not present

## 2015-12-30 DIAGNOSIS — T814XXA Infection following a procedure, initial encounter: Secondary | ICD-10-CM | POA: Diagnosis not present

## 2015-12-30 DIAGNOSIS — Z4801 Encounter for change or removal of surgical wound dressing: Secondary | ICD-10-CM | POA: Diagnosis not present

## 2015-12-30 DIAGNOSIS — E119 Type 2 diabetes mellitus without complications: Secondary | ICD-10-CM | POA: Diagnosis not present

## 2016-01-01 DIAGNOSIS — Z09 Encounter for follow-up examination after completed treatment for conditions other than malignant neoplasm: Secondary | ICD-10-CM | POA: Diagnosis not present

## 2016-01-03 DIAGNOSIS — Z7984 Long term (current) use of oral hypoglycemic drugs: Secondary | ICD-10-CM | POA: Diagnosis not present

## 2016-01-03 DIAGNOSIS — E119 Type 2 diabetes mellitus without complications: Secondary | ICD-10-CM | POA: Diagnosis not present

## 2016-01-03 DIAGNOSIS — Z4801 Encounter for change or removal of surgical wound dressing: Secondary | ICD-10-CM | POA: Diagnosis not present

## 2016-01-03 DIAGNOSIS — T814XXA Infection following a procedure, initial encounter: Secondary | ICD-10-CM | POA: Diagnosis not present

## 2016-01-06 DIAGNOSIS — Z4801 Encounter for change or removal of surgical wound dressing: Secondary | ICD-10-CM | POA: Diagnosis not present

## 2016-01-06 DIAGNOSIS — Z7984 Long term (current) use of oral hypoglycemic drugs: Secondary | ICD-10-CM | POA: Diagnosis not present

## 2016-01-06 DIAGNOSIS — T814XXA Infection following a procedure, initial encounter: Secondary | ICD-10-CM | POA: Diagnosis not present

## 2016-01-06 DIAGNOSIS — E119 Type 2 diabetes mellitus without complications: Secondary | ICD-10-CM | POA: Diagnosis not present

## 2016-01-08 DIAGNOSIS — T8131XD Disruption of external operation (surgical) wound, not elsewhere classified, subsequent encounter: Secondary | ICD-10-CM | POA: Diagnosis not present

## 2016-01-08 DIAGNOSIS — L0390 Cellulitis, unspecified: Secondary | ICD-10-CM | POA: Diagnosis not present

## 2016-01-08 DIAGNOSIS — E119 Type 2 diabetes mellitus without complications: Secondary | ICD-10-CM | POA: Diagnosis not present

## 2016-01-08 DIAGNOSIS — Z7984 Long term (current) use of oral hypoglycemic drugs: Secondary | ICD-10-CM | POA: Diagnosis not present

## 2016-01-08 DIAGNOSIS — Z4801 Encounter for change or removal of surgical wound dressing: Secondary | ICD-10-CM | POA: Diagnosis not present

## 2016-01-08 DIAGNOSIS — Y838 Other surgical procedures as the cause of abnormal reaction of the patient, or of later complication, without mention of misadventure at the time of the procedure: Secondary | ICD-10-CM | POA: Diagnosis not present

## 2016-01-08 DIAGNOSIS — T814XXA Infection following a procedure, initial encounter: Secondary | ICD-10-CM | POA: Diagnosis not present

## 2016-01-13 DIAGNOSIS — T814XXA Infection following a procedure, initial encounter: Secondary | ICD-10-CM | POA: Diagnosis not present

## 2016-01-13 DIAGNOSIS — T148 Other injury of unspecified body region: Secondary | ICD-10-CM | POA: Diagnosis not present

## 2016-01-13 DIAGNOSIS — Z7984 Long term (current) use of oral hypoglycemic drugs: Secondary | ICD-10-CM | POA: Diagnosis not present

## 2016-01-13 DIAGNOSIS — Z5181 Encounter for therapeutic drug level monitoring: Secondary | ICD-10-CM | POA: Diagnosis not present

## 2016-01-13 DIAGNOSIS — Z79899 Other long term (current) drug therapy: Secondary | ICD-10-CM | POA: Diagnosis not present

## 2016-01-22 DIAGNOSIS — Y838 Other surgical procedures as the cause of abnormal reaction of the patient, or of later complication, without mention of misadventure at the time of the procedure: Secondary | ICD-10-CM | POA: Diagnosis not present

## 2016-01-22 DIAGNOSIS — R69 Illness, unspecified: Secondary | ICD-10-CM | POA: Diagnosis not present

## 2016-01-22 DIAGNOSIS — L0889 Other specified local infections of the skin and subcutaneous tissue: Secondary | ICD-10-CM | POA: Diagnosis not present

## 2016-01-22 DIAGNOSIS — T8131XD Disruption of external operation (surgical) wound, not elsewhere classified, subsequent encounter: Secondary | ICD-10-CM | POA: Diagnosis not present

## 2016-01-22 DIAGNOSIS — L089 Local infection of the skin and subcutaneous tissue, unspecified: Secondary | ICD-10-CM | POA: Diagnosis not present

## 2016-01-22 DIAGNOSIS — S31109D Unspecified open wound of abdominal wall, unspecified quadrant without penetration into peritoneal cavity, subsequent encounter: Secondary | ICD-10-CM | POA: Diagnosis not present

## 2016-01-31 DIAGNOSIS — Z7984 Long term (current) use of oral hypoglycemic drugs: Secondary | ICD-10-CM | POA: Diagnosis not present

## 2016-01-31 DIAGNOSIS — T814XXA Infection following a procedure, initial encounter: Secondary | ICD-10-CM | POA: Diagnosis not present

## 2016-01-31 DIAGNOSIS — Z4801 Encounter for change or removal of surgical wound dressing: Secondary | ICD-10-CM | POA: Diagnosis not present

## 2016-01-31 DIAGNOSIS — E119 Type 2 diabetes mellitus without complications: Secondary | ICD-10-CM | POA: Diagnosis not present

## 2016-02-05 DIAGNOSIS — Z7984 Long term (current) use of oral hypoglycemic drugs: Secondary | ICD-10-CM | POA: Diagnosis not present

## 2016-02-05 DIAGNOSIS — Z4801 Encounter for change or removal of surgical wound dressing: Secondary | ICD-10-CM | POA: Diagnosis not present

## 2016-02-05 DIAGNOSIS — E119 Type 2 diabetes mellitus without complications: Secondary | ICD-10-CM | POA: Diagnosis not present

## 2016-02-05 DIAGNOSIS — T814XXA Infection following a procedure, initial encounter: Secondary | ICD-10-CM | POA: Diagnosis not present

## 2016-02-12 DIAGNOSIS — R69 Illness, unspecified: Secondary | ICD-10-CM | POA: Diagnosis not present

## 2016-02-12 DIAGNOSIS — X58XXXD Exposure to other specified factors, subsequent encounter: Secondary | ICD-10-CM | POA: Diagnosis not present

## 2016-02-12 DIAGNOSIS — T148XXD Other injury of unspecified body region, subsequent encounter: Secondary | ICD-10-CM | POA: Diagnosis not present

## 2016-02-12 DIAGNOSIS — Z7984 Long term (current) use of oral hypoglycemic drugs: Secondary | ICD-10-CM | POA: Diagnosis not present

## 2016-02-12 DIAGNOSIS — T814XXA Infection following a procedure, initial encounter: Secondary | ICD-10-CM | POA: Diagnosis not present

## 2016-02-12 DIAGNOSIS — T148XXA Other injury of unspecified body region, initial encounter: Secondary | ICD-10-CM | POA: Diagnosis not present

## 2016-02-12 DIAGNOSIS — R1011 Right upper quadrant pain: Secondary | ICD-10-CM | POA: Diagnosis not present

## 2016-02-12 DIAGNOSIS — Z4801 Encounter for change or removal of surgical wound dressing: Secondary | ICD-10-CM | POA: Diagnosis not present

## 2016-02-12 DIAGNOSIS — E119 Type 2 diabetes mellitus without complications: Secondary | ICD-10-CM | POA: Diagnosis not present

## 2016-02-17 DIAGNOSIS — Z7984 Long term (current) use of oral hypoglycemic drugs: Secondary | ICD-10-CM | POA: Diagnosis not present

## 2016-02-17 DIAGNOSIS — Z4801 Encounter for change or removal of surgical wound dressing: Secondary | ICD-10-CM | POA: Diagnosis not present

## 2016-02-17 DIAGNOSIS — E119 Type 2 diabetes mellitus without complications: Secondary | ICD-10-CM | POA: Diagnosis not present

## 2016-02-17 DIAGNOSIS — T814XXA Infection following a procedure, initial encounter: Secondary | ICD-10-CM | POA: Diagnosis not present

## 2016-02-19 DIAGNOSIS — R69 Illness, unspecified: Secondary | ICD-10-CM | POA: Diagnosis not present

## 2016-02-19 DIAGNOSIS — X58XXXA Exposure to other specified factors, initial encounter: Secondary | ICD-10-CM | POA: Diagnosis not present

## 2016-02-19 DIAGNOSIS — I96 Gangrene, not elsewhere classified: Secondary | ICD-10-CM | POA: Diagnosis not present

## 2016-02-19 DIAGNOSIS — T148XXA Other injury of unspecified body region, initial encounter: Secondary | ICD-10-CM | POA: Diagnosis not present

## 2016-02-19 DIAGNOSIS — K439 Ventral hernia without obstruction or gangrene: Secondary | ICD-10-CM | POA: Diagnosis not present

## 2016-02-19 DIAGNOSIS — S31109A Unspecified open wound of abdominal wall, unspecified quadrant without penetration into peritoneal cavity, initial encounter: Secondary | ICD-10-CM | POA: Diagnosis not present

## 2016-02-26 DIAGNOSIS — Z7984 Long term (current) use of oral hypoglycemic drugs: Secondary | ICD-10-CM | POA: Diagnosis not present

## 2016-02-26 DIAGNOSIS — E119 Type 2 diabetes mellitus without complications: Secondary | ICD-10-CM | POA: Diagnosis not present

## 2016-02-26 DIAGNOSIS — Z4801 Encounter for change or removal of surgical wound dressing: Secondary | ICD-10-CM | POA: Diagnosis not present

## 2016-02-26 DIAGNOSIS — T814XXA Infection following a procedure, initial encounter: Secondary | ICD-10-CM | POA: Diagnosis not present

## 2016-02-27 DIAGNOSIS — E119 Type 2 diabetes mellitus without complications: Secondary | ICD-10-CM | POA: Diagnosis not present

## 2016-02-27 DIAGNOSIS — Z4801 Encounter for change or removal of surgical wound dressing: Secondary | ICD-10-CM | POA: Diagnosis not present

## 2016-02-27 DIAGNOSIS — T814XXA Infection following a procedure, initial encounter: Secondary | ICD-10-CM | POA: Diagnosis not present

## 2016-02-27 DIAGNOSIS — Z7984 Long term (current) use of oral hypoglycemic drugs: Secondary | ICD-10-CM | POA: Diagnosis not present

## 2016-02-28 DIAGNOSIS — E119 Type 2 diabetes mellitus without complications: Secondary | ICD-10-CM | POA: Diagnosis not present

## 2016-02-28 DIAGNOSIS — T814XXA Infection following a procedure, initial encounter: Secondary | ICD-10-CM | POA: Diagnosis not present

## 2016-02-28 DIAGNOSIS — Z7984 Long term (current) use of oral hypoglycemic drugs: Secondary | ICD-10-CM | POA: Diagnosis not present

## 2016-03-04 DIAGNOSIS — X58XXXD Exposure to other specified factors, subsequent encounter: Secondary | ICD-10-CM | POA: Diagnosis not present

## 2016-03-04 DIAGNOSIS — R69 Illness, unspecified: Secondary | ICD-10-CM | POA: Diagnosis not present

## 2016-03-04 DIAGNOSIS — S31109D Unspecified open wound of abdominal wall, unspecified quadrant without penetration into peritoneal cavity, subsequent encounter: Secondary | ICD-10-CM | POA: Diagnosis not present

## 2016-03-17 DIAGNOSIS — R600 Localized edema: Secondary | ICD-10-CM | POA: Diagnosis not present

## 2016-03-17 DIAGNOSIS — R1084 Generalized abdominal pain: Secondary | ICD-10-CM | POA: Diagnosis not present

## 2016-03-17 DIAGNOSIS — R69 Illness, unspecified: Secondary | ICD-10-CM | POA: Diagnosis not present

## 2016-03-17 DIAGNOSIS — E010 Iodine-deficiency related diffuse (endemic) goiter: Secondary | ICD-10-CM | POA: Diagnosis not present

## 2016-03-17 DIAGNOSIS — E1165 Type 2 diabetes mellitus with hyperglycemia: Secondary | ICD-10-CM | POA: Diagnosis not present

## 2016-03-25 DIAGNOSIS — S31109D Unspecified open wound of abdominal wall, unspecified quadrant without penetration into peritoneal cavity, subsequent encounter: Secondary | ICD-10-CM | POA: Diagnosis not present

## 2016-03-25 DIAGNOSIS — X58XXXD Exposure to other specified factors, subsequent encounter: Secondary | ICD-10-CM | POA: Diagnosis not present

## 2016-04-03 DIAGNOSIS — S31609A Unspecified open wound of abdominal wall, unspecified quadrant with penetration into peritoneal cavity, initial encounter: Secondary | ICD-10-CM | POA: Diagnosis not present

## 2016-04-07 DIAGNOSIS — E010 Iodine-deficiency related diffuse (endemic) goiter: Secondary | ICD-10-CM | POA: Diagnosis not present

## 2016-04-22 DIAGNOSIS — T148XXA Other injury of unspecified body region, initial encounter: Secondary | ICD-10-CM | POA: Diagnosis not present

## 2016-04-22 DIAGNOSIS — X58XXXD Exposure to other specified factors, subsequent encounter: Secondary | ICD-10-CM | POA: Diagnosis not present

## 2016-04-22 DIAGNOSIS — R110 Nausea: Secondary | ICD-10-CM | POA: Diagnosis not present

## 2016-04-22 DIAGNOSIS — E042 Nontoxic multinodular goiter: Secondary | ICD-10-CM | POA: Diagnosis not present

## 2016-04-22 DIAGNOSIS — R69 Illness, unspecified: Secondary | ICD-10-CM | POA: Diagnosis not present

## 2016-04-22 DIAGNOSIS — T148XXD Other injury of unspecified body region, subsequent encounter: Secondary | ICD-10-CM | POA: Diagnosis not present

## 2016-04-23 DIAGNOSIS — R69 Illness, unspecified: Secondary | ICD-10-CM | POA: Diagnosis not present

## 2016-05-11 DIAGNOSIS — R69 Illness, unspecified: Secondary | ICD-10-CM | POA: Diagnosis not present

## 2016-05-19 DIAGNOSIS — R69 Illness, unspecified: Secondary | ICD-10-CM | POA: Diagnosis not present

## 2016-05-29 DIAGNOSIS — Z79891 Long term (current) use of opiate analgesic: Secondary | ICD-10-CM | POA: Diagnosis not present

## 2016-06-01 DIAGNOSIS — R69 Illness, unspecified: Secondary | ICD-10-CM | POA: Diagnosis not present

## 2016-06-08 DIAGNOSIS — R69 Illness, unspecified: Secondary | ICD-10-CM | POA: Diagnosis not present

## 2016-06-10 DIAGNOSIS — T148XXA Other injury of unspecified body region, initial encounter: Secondary | ICD-10-CM | POA: Diagnosis not present

## 2016-06-10 DIAGNOSIS — R110 Nausea: Secondary | ICD-10-CM | POA: Diagnosis not present

## 2016-06-10 DIAGNOSIS — R509 Fever, unspecified: Secondary | ICD-10-CM | POA: Diagnosis not present

## 2016-06-10 DIAGNOSIS — X58XXXD Exposure to other specified factors, subsequent encounter: Secondary | ICD-10-CM | POA: Diagnosis not present

## 2016-06-10 DIAGNOSIS — S31109D Unspecified open wound of abdominal wall, unspecified quadrant without penetration into peritoneal cavity, subsequent encounter: Secondary | ICD-10-CM | POA: Diagnosis not present

## 2016-08-17 DIAGNOSIS — L680 Hirsutism: Secondary | ICD-10-CM | POA: Diagnosis not present

## 2016-08-17 DIAGNOSIS — R87612 Low grade squamous intraepithelial lesion on cytologic smear of cervix (LGSIL): Secondary | ICD-10-CM | POA: Diagnosis not present

## 2016-08-17 DIAGNOSIS — Z87411 Personal history of vaginal dysplasia: Secondary | ICD-10-CM | POA: Diagnosis not present

## 2016-08-17 DIAGNOSIS — Z01419 Encounter for gynecological examination (general) (routine) without abnormal findings: Secondary | ICD-10-CM | POA: Diagnosis not present

## 2016-08-17 DIAGNOSIS — N951 Menopausal and female climacteric states: Secondary | ICD-10-CM | POA: Diagnosis not present

## 2016-08-17 DIAGNOSIS — Z1231 Encounter for screening mammogram for malignant neoplasm of breast: Secondary | ICD-10-CM | POA: Diagnosis not present

## 2016-08-26 DIAGNOSIS — X58XXXD Exposure to other specified factors, subsequent encounter: Secondary | ICD-10-CM | POA: Diagnosis not present

## 2016-08-26 DIAGNOSIS — R110 Nausea: Secondary | ICD-10-CM | POA: Diagnosis not present

## 2016-08-26 DIAGNOSIS — S31109D Unspecified open wound of abdominal wall, unspecified quadrant without penetration into peritoneal cavity, subsequent encounter: Secondary | ICD-10-CM | POA: Diagnosis not present

## 2016-09-08 DIAGNOSIS — R87622 Low grade squamous intraepithelial lesion on cytologic smear of vagina (LGSIL): Secondary | ICD-10-CM | POA: Diagnosis not present

## 2016-09-08 DIAGNOSIS — N870 Mild cervical dysplasia: Secondary | ICD-10-CM | POA: Diagnosis not present

## 2016-09-08 DIAGNOSIS — Z87411 Personal history of vaginal dysplasia: Secondary | ICD-10-CM | POA: Diagnosis not present

## 2016-09-08 DIAGNOSIS — R350 Frequency of micturition: Secondary | ICD-10-CM | POA: Diagnosis not present

## 2016-09-11 DIAGNOSIS — L905 Scar conditions and fibrosis of skin: Secondary | ICD-10-CM | POA: Diagnosis not present

## 2016-09-11 DIAGNOSIS — Y838 Other surgical procedures as the cause of abnormal reaction of the patient, or of later complication, without mention of misadventure at the time of the procedure: Secondary | ICD-10-CM | POA: Diagnosis not present

## 2016-09-11 DIAGNOSIS — T8579XA Infection and inflammatory reaction due to other internal prosthetic devices, implants and grafts, initial encounter: Secondary | ICD-10-CM | POA: Diagnosis not present

## 2016-09-11 DIAGNOSIS — T8189XD Other complications of procedures, not elsewhere classified, subsequent encounter: Secondary | ICD-10-CM | POA: Diagnosis not present

## 2016-09-16 DIAGNOSIS — R69 Illness, unspecified: Secondary | ICD-10-CM | POA: Diagnosis not present

## 2016-09-16 DIAGNOSIS — E1165 Type 2 diabetes mellitus with hyperglycemia: Secondary | ICD-10-CM | POA: Diagnosis not present

## 2016-09-16 DIAGNOSIS — G4733 Obstructive sleep apnea (adult) (pediatric): Secondary | ICD-10-CM | POA: Diagnosis not present

## 2016-09-16 DIAGNOSIS — D072 Carcinoma in situ of vagina: Secondary | ICD-10-CM | POA: Diagnosis not present

## 2016-09-16 DIAGNOSIS — R1084 Generalized abdominal pain: Secondary | ICD-10-CM | POA: Diagnosis not present

## 2016-09-16 DIAGNOSIS — R600 Localized edema: Secondary | ICD-10-CM | POA: Diagnosis not present

## 2016-09-16 DIAGNOSIS — Z90710 Acquired absence of both cervix and uterus: Secondary | ICD-10-CM | POA: Diagnosis not present

## 2016-10-15 DIAGNOSIS — Z01818 Encounter for other preprocedural examination: Secondary | ICD-10-CM | POA: Diagnosis not present

## 2016-10-15 DIAGNOSIS — D072 Carcinoma in situ of vagina: Secondary | ICD-10-CM | POA: Diagnosis not present

## 2016-10-15 DIAGNOSIS — Z8614 Personal history of Methicillin resistant Staphylococcus aureus infection: Secondary | ICD-10-CM | POA: Diagnosis not present

## 2016-10-15 DIAGNOSIS — Z7984 Long term (current) use of oral hypoglycemic drugs: Secondary | ICD-10-CM | POA: Diagnosis not present

## 2016-10-15 DIAGNOSIS — R69 Illness, unspecified: Secondary | ICD-10-CM | POA: Diagnosis not present

## 2016-10-15 DIAGNOSIS — Z79891 Long term (current) use of opiate analgesic: Secondary | ICD-10-CM | POA: Diagnosis not present

## 2016-10-15 DIAGNOSIS — Z01810 Encounter for preprocedural cardiovascular examination: Secondary | ICD-10-CM | POA: Diagnosis not present

## 2016-10-15 DIAGNOSIS — I10 Essential (primary) hypertension: Secondary | ICD-10-CM | POA: Diagnosis not present

## 2016-10-15 DIAGNOSIS — K219 Gastro-esophageal reflux disease without esophagitis: Secondary | ICD-10-CM | POA: Diagnosis not present

## 2016-10-15 DIAGNOSIS — E119 Type 2 diabetes mellitus without complications: Secondary | ICD-10-CM | POA: Diagnosis not present

## 2016-10-15 DIAGNOSIS — R87622 Low grade squamous intraepithelial lesion on cytologic smear of vagina (LGSIL): Secondary | ICD-10-CM | POA: Diagnosis not present

## 2016-10-19 DIAGNOSIS — Z79891 Long term (current) use of opiate analgesic: Secondary | ICD-10-CM | POA: Diagnosis not present

## 2016-10-19 DIAGNOSIS — E119 Type 2 diabetes mellitus without complications: Secondary | ICD-10-CM | POA: Diagnosis not present

## 2016-10-19 DIAGNOSIS — N891 Moderate vaginal dysplasia: Secondary | ICD-10-CM | POA: Diagnosis not present

## 2016-10-19 DIAGNOSIS — N871 Moderate cervical dysplasia: Secondary | ICD-10-CM | POA: Diagnosis not present

## 2016-10-19 DIAGNOSIS — R69 Illness, unspecified: Secondary | ICD-10-CM | POA: Diagnosis not present

## 2016-10-19 DIAGNOSIS — I10 Essential (primary) hypertension: Secondary | ICD-10-CM | POA: Diagnosis not present

## 2016-10-19 DIAGNOSIS — Z8614 Personal history of Methicillin resistant Staphylococcus aureus infection: Secondary | ICD-10-CM | POA: Diagnosis not present

## 2016-10-19 DIAGNOSIS — R87622 Low grade squamous intraepithelial lesion on cytologic smear of vagina (LGSIL): Secondary | ICD-10-CM | POA: Diagnosis not present

## 2016-10-19 DIAGNOSIS — K219 Gastro-esophageal reflux disease without esophagitis: Secondary | ICD-10-CM | POA: Diagnosis not present

## 2016-10-19 DIAGNOSIS — Z7984 Long term (current) use of oral hypoglycemic drugs: Secondary | ICD-10-CM | POA: Diagnosis not present

## 2016-10-19 DIAGNOSIS — D072 Carcinoma in situ of vagina: Secondary | ICD-10-CM | POA: Diagnosis not present

## 2016-10-26 DIAGNOSIS — Z86010 Personal history of colonic polyps: Secondary | ICD-10-CM | POA: Diagnosis not present

## 2016-10-26 DIAGNOSIS — Z1231 Encounter for screening mammogram for malignant neoplasm of breast: Secondary | ICD-10-CM | POA: Diagnosis not present

## 2016-10-26 DIAGNOSIS — R69 Illness, unspecified: Secondary | ICD-10-CM | POA: Diagnosis not present

## 2016-11-02 DIAGNOSIS — H524 Presbyopia: Secondary | ICD-10-CM | POA: Diagnosis not present

## 2016-11-03 DIAGNOSIS — Z86010 Personal history of colonic polyps: Secondary | ICD-10-CM | POA: Diagnosis not present

## 2016-11-06 DIAGNOSIS — K921 Melena: Secondary | ICD-10-CM | POA: Diagnosis not present

## 2016-11-06 DIAGNOSIS — K5900 Constipation, unspecified: Secondary | ICD-10-CM | POA: Diagnosis not present

## 2016-11-06 DIAGNOSIS — K644 Residual hemorrhoidal skin tags: Secondary | ICD-10-CM | POA: Diagnosis not present

## 2016-11-06 DIAGNOSIS — E119 Type 2 diabetes mellitus without complications: Secondary | ICD-10-CM | POA: Diagnosis not present

## 2016-11-06 DIAGNOSIS — Z8371 Family history of colonic polyps: Secondary | ICD-10-CM | POA: Diagnosis not present

## 2016-11-06 DIAGNOSIS — K5730 Diverticulosis of large intestine without perforation or abscess without bleeding: Secondary | ICD-10-CM | POA: Diagnosis not present

## 2016-11-06 DIAGNOSIS — Z8614 Personal history of Methicillin resistant Staphylococcus aureus infection: Secondary | ICD-10-CM | POA: Diagnosis not present

## 2016-11-06 DIAGNOSIS — D126 Benign neoplasm of colon, unspecified: Secondary | ICD-10-CM | POA: Diagnosis not present

## 2016-11-06 DIAGNOSIS — Z800 Family history of malignant neoplasm of digestive organs: Secondary | ICD-10-CM | POA: Diagnosis not present

## 2016-11-06 DIAGNOSIS — Z86010 Personal history of colonic polyps: Secondary | ICD-10-CM | POA: Diagnosis not present

## 2016-11-09 DIAGNOSIS — D126 Benign neoplasm of colon, unspecified: Secondary | ICD-10-CM | POA: Diagnosis not present

## 2016-11-09 DIAGNOSIS — K5731 Diverticulosis of large intestine without perforation or abscess with bleeding: Secondary | ICD-10-CM | POA: Diagnosis not present

## 2016-11-09 DIAGNOSIS — K625 Hemorrhage of anus and rectum: Secondary | ICD-10-CM | POA: Diagnosis not present

## 2016-11-09 DIAGNOSIS — Z8614 Personal history of Methicillin resistant Staphylococcus aureus infection: Secondary | ICD-10-CM | POA: Diagnosis not present

## 2016-11-09 DIAGNOSIS — D125 Benign neoplasm of sigmoid colon: Secondary | ICD-10-CM | POA: Diagnosis not present

## 2016-11-09 DIAGNOSIS — K921 Melena: Secondary | ICD-10-CM | POA: Diagnosis not present

## 2016-11-09 DIAGNOSIS — K5900 Constipation, unspecified: Secondary | ICD-10-CM | POA: Diagnosis not present

## 2016-11-09 DIAGNOSIS — Z1211 Encounter for screening for malignant neoplasm of colon: Secondary | ICD-10-CM | POA: Diagnosis not present

## 2016-11-09 DIAGNOSIS — K529 Noninfective gastroenteritis and colitis, unspecified: Secondary | ICD-10-CM | POA: Diagnosis not present

## 2016-11-09 DIAGNOSIS — K644 Residual hemorrhoidal skin tags: Secondary | ICD-10-CM | POA: Diagnosis not present

## 2016-11-09 DIAGNOSIS — Z8371 Family history of colonic polyps: Secondary | ICD-10-CM | POA: Diagnosis not present

## 2016-11-09 DIAGNOSIS — E119 Type 2 diabetes mellitus without complications: Secondary | ICD-10-CM | POA: Diagnosis not present

## 2016-11-09 DIAGNOSIS — K5730 Diverticulosis of large intestine without perforation or abscess without bleeding: Secondary | ICD-10-CM | POA: Diagnosis not present

## 2016-11-09 DIAGNOSIS — Z86010 Personal history of colonic polyps: Secondary | ICD-10-CM | POA: Diagnosis not present

## 2016-11-09 DIAGNOSIS — K641 Second degree hemorrhoids: Secondary | ICD-10-CM | POA: Diagnosis not present

## 2016-11-09 DIAGNOSIS — Z800 Family history of malignant neoplasm of digestive organs: Secondary | ICD-10-CM | POA: Diagnosis not present

## 2016-11-17 DIAGNOSIS — K5731 Diverticulosis of large intestine without perforation or abscess with bleeding: Secondary | ICD-10-CM | POA: Diagnosis not present

## 2016-11-17 DIAGNOSIS — K641 Second degree hemorrhoids: Secondary | ICD-10-CM | POA: Diagnosis not present

## 2016-11-17 DIAGNOSIS — D125 Benign neoplasm of sigmoid colon: Secondary | ICD-10-CM | POA: Diagnosis not present

## 2016-11-23 DIAGNOSIS — H3562 Retinal hemorrhage, left eye: Secondary | ICD-10-CM | POA: Diagnosis not present

## 2016-11-23 DIAGNOSIS — E1137X1 Type 2 diabetes mellitus with diabetic macular edema, resolved following treatment, right eye: Secondary | ICD-10-CM | POA: Diagnosis not present

## 2016-12-08 DIAGNOSIS — Z5181 Encounter for therapeutic drug level monitoring: Secondary | ICD-10-CM | POA: Diagnosis not present

## 2016-12-08 DIAGNOSIS — F1120 Opioid dependence, uncomplicated: Secondary | ICD-10-CM | POA: Diagnosis not present

## 2016-12-08 DIAGNOSIS — Z0389 Encounter for observation for other suspected diseases and conditions ruled out: Secondary | ICD-10-CM | POA: Diagnosis not present

## 2017-01-18 DIAGNOSIS — R69 Illness, unspecified: Secondary | ICD-10-CM | POA: Diagnosis not present

## 2017-01-18 DIAGNOSIS — E119 Type 2 diabetes mellitus without complications: Secondary | ICD-10-CM | POA: Diagnosis not present

## 2017-01-18 DIAGNOSIS — E782 Mixed hyperlipidemia: Secondary | ICD-10-CM | POA: Diagnosis not present

## 2017-01-18 DIAGNOSIS — Z6829 Body mass index (BMI) 29.0-29.9, adult: Secondary | ICD-10-CM | POA: Diagnosis not present

## 2017-01-18 DIAGNOSIS — I10 Essential (primary) hypertension: Secondary | ICD-10-CM | POA: Diagnosis not present

## 2017-05-14 DIAGNOSIS — Z79891 Long term (current) use of opiate analgesic: Secondary | ICD-10-CM | POA: Diagnosis not present

## 2017-05-20 DIAGNOSIS — E782 Mixed hyperlipidemia: Secondary | ICD-10-CM | POA: Diagnosis not present

## 2017-05-20 DIAGNOSIS — I10 Essential (primary) hypertension: Secondary | ICD-10-CM | POA: Diagnosis not present

## 2017-05-20 DIAGNOSIS — Z6831 Body mass index (BMI) 31.0-31.9, adult: Secondary | ICD-10-CM | POA: Diagnosis not present

## 2017-05-20 DIAGNOSIS — R69 Illness, unspecified: Secondary | ICD-10-CM | POA: Diagnosis not present

## 2017-05-20 DIAGNOSIS — E119 Type 2 diabetes mellitus without complications: Secondary | ICD-10-CM | POA: Diagnosis not present

## 2017-06-02 DIAGNOSIS — K439 Ventral hernia without obstruction or gangrene: Secondary | ICD-10-CM | POA: Diagnosis not present

## 2017-07-26 DIAGNOSIS — Z79891 Long term (current) use of opiate analgesic: Secondary | ICD-10-CM | POA: Diagnosis not present

## 2017-07-28 DIAGNOSIS — Z9189 Other specified personal risk factors, not elsewhere classified: Secondary | ICD-10-CM | POA: Diagnosis not present

## 2017-07-28 DIAGNOSIS — Z1272 Encounter for screening for malignant neoplasm of vagina: Secondary | ICD-10-CM | POA: Diagnosis not present

## 2017-07-28 DIAGNOSIS — Z01419 Encounter for gynecological examination (general) (routine) without abnormal findings: Secondary | ICD-10-CM | POA: Diagnosis not present

## 2017-07-28 DIAGNOSIS — R87620 Atypical squamous cells of undetermined significance on cytologic smear of vagina (ASC-US): Secondary | ICD-10-CM | POA: Diagnosis not present

## 2017-07-28 DIAGNOSIS — Z1231 Encounter for screening mammogram for malignant neoplasm of breast: Secondary | ICD-10-CM | POA: Diagnosis not present

## 2017-07-28 DIAGNOSIS — N951 Menopausal and female climacteric states: Secondary | ICD-10-CM | POA: Diagnosis not present

## 2017-07-28 DIAGNOSIS — Z87411 Personal history of vaginal dysplasia: Secondary | ICD-10-CM | POA: Diagnosis not present

## 2017-07-28 DIAGNOSIS — L680 Hirsutism: Secondary | ICD-10-CM | POA: Diagnosis not present

## 2017-07-29 DIAGNOSIS — Z01419 Encounter for gynecological examination (general) (routine) without abnormal findings: Secondary | ICD-10-CM | POA: Diagnosis not present

## 2017-08-03 DIAGNOSIS — Y33XXXA Other specified events, undetermined intent, initial encounter: Secondary | ICD-10-CM | POA: Diagnosis not present

## 2017-08-03 DIAGNOSIS — K760 Fatty (change of) liver, not elsewhere classified: Secondary | ICD-10-CM | POA: Diagnosis not present

## 2017-08-03 DIAGNOSIS — T8579XA Infection and inflammatory reaction due to other internal prosthetic devices, implants and grafts, initial encounter: Secondary | ICD-10-CM | POA: Diagnosis not present

## 2017-08-03 DIAGNOSIS — Y812 Prosthetic and other implants, materials and accessory general- and plastic-surgery devices associated with adverse incidents: Secondary | ICD-10-CM | POA: Diagnosis not present

## 2017-08-23 DIAGNOSIS — E782 Mixed hyperlipidemia: Secondary | ICD-10-CM | POA: Diagnosis not present

## 2017-08-23 DIAGNOSIS — I10 Essential (primary) hypertension: Secondary | ICD-10-CM | POA: Diagnosis not present

## 2017-08-23 DIAGNOSIS — R69 Illness, unspecified: Secondary | ICD-10-CM | POA: Diagnosis not present

## 2017-08-23 DIAGNOSIS — Z6829 Body mass index (BMI) 29.0-29.9, adult: Secondary | ICD-10-CM | POA: Diagnosis not present

## 2017-08-23 DIAGNOSIS — E119 Type 2 diabetes mellitus without complications: Secondary | ICD-10-CM | POA: Diagnosis not present

## 2017-08-25 DIAGNOSIS — Z09 Encounter for follow-up examination after completed treatment for conditions other than malignant neoplasm: Secondary | ICD-10-CM | POA: Diagnosis not present

## 2017-09-15 DIAGNOSIS — L905 Scar conditions and fibrosis of skin: Secondary | ICD-10-CM | POA: Diagnosis not present

## 2017-09-15 DIAGNOSIS — E119 Type 2 diabetes mellitus without complications: Secondary | ICD-10-CM | POA: Diagnosis not present

## 2017-09-15 DIAGNOSIS — E669 Obesity, unspecified: Secondary | ICD-10-CM | POA: Diagnosis not present

## 2017-09-15 DIAGNOSIS — Z8614 Personal history of Methicillin resistant Staphylococcus aureus infection: Secondary | ICD-10-CM | POA: Diagnosis not present

## 2017-09-21 DIAGNOSIS — G2581 Restless legs syndrome: Secondary | ICD-10-CM | POA: Diagnosis not present

## 2017-09-21 DIAGNOSIS — E119 Type 2 diabetes mellitus without complications: Secondary | ICD-10-CM | POA: Diagnosis not present

## 2017-09-21 DIAGNOSIS — T85698A Other mechanical complication of other specified internal prosthetic devices, implants and grafts, initial encounter: Secondary | ICD-10-CM | POA: Diagnosis not present

## 2017-09-21 DIAGNOSIS — T8579XA Infection and inflammatory reaction due to other internal prosthetic devices, implants and grafts, initial encounter: Secondary | ICD-10-CM | POA: Diagnosis not present

## 2017-09-21 DIAGNOSIS — K432 Incisional hernia without obstruction or gangrene: Secondary | ICD-10-CM | POA: Diagnosis not present

## 2017-09-21 DIAGNOSIS — K219 Gastro-esophageal reflux disease without esophagitis: Secondary | ICD-10-CM | POA: Diagnosis not present

## 2017-09-21 DIAGNOSIS — Z7984 Long term (current) use of oral hypoglycemic drugs: Secondary | ICD-10-CM | POA: Diagnosis not present

## 2017-09-21 DIAGNOSIS — Z9889 Other specified postprocedural states: Secondary | ICD-10-CM | POA: Diagnosis not present

## 2017-09-21 DIAGNOSIS — R69 Illness, unspecified: Secondary | ICD-10-CM | POA: Diagnosis not present

## 2017-09-21 DIAGNOSIS — Y832 Surgical operation with anastomosis, bypass or graft as the cause of abnormal reaction of the patient, or of later complication, without mention of misadventure at the time of the procedure: Secondary | ICD-10-CM | POA: Diagnosis not present

## 2017-09-21 DIAGNOSIS — E785 Hyperlipidemia, unspecified: Secondary | ICD-10-CM | POA: Diagnosis not present

## 2017-09-21 DIAGNOSIS — G8918 Other acute postprocedural pain: Secondary | ICD-10-CM | POA: Diagnosis not present

## 2017-09-21 DIAGNOSIS — K439 Ventral hernia without obstruction or gangrene: Secondary | ICD-10-CM | POA: Diagnosis not present

## 2017-09-21 DIAGNOSIS — G4730 Sleep apnea, unspecified: Secondary | ICD-10-CM | POA: Diagnosis not present

## 2017-10-27 DIAGNOSIS — X58XXXD Exposure to other specified factors, subsequent encounter: Secondary | ICD-10-CM | POA: Diagnosis not present

## 2017-10-27 DIAGNOSIS — D72829 Elevated white blood cell count, unspecified: Secondary | ICD-10-CM | POA: Diagnosis not present

## 2017-10-27 DIAGNOSIS — S31109D Unspecified open wound of abdominal wall, unspecified quadrant without penetration into peritoneal cavity, subsequent encounter: Secondary | ICD-10-CM | POA: Diagnosis not present

## 2017-10-27 DIAGNOSIS — Z9889 Other specified postprocedural states: Secondary | ICD-10-CM | POA: Diagnosis not present

## 2017-10-27 DIAGNOSIS — Z87891 Personal history of nicotine dependence: Secondary | ICD-10-CM | POA: Diagnosis not present

## 2017-11-13 DIAGNOSIS — E872 Acidosis: Secondary | ICD-10-CM | POA: Diagnosis not present

## 2017-11-13 DIAGNOSIS — I517 Cardiomegaly: Secondary | ICD-10-CM | POA: Diagnosis not present

## 2017-11-13 DIAGNOSIS — R4182 Altered mental status, unspecified: Secondary | ICD-10-CM | POA: Diagnosis not present

## 2017-11-13 DIAGNOSIS — E875 Hyperkalemia: Secondary | ICD-10-CM | POA: Diagnosis not present

## 2017-11-13 DIAGNOSIS — R69 Illness, unspecified: Secondary | ICD-10-CM | POA: Diagnosis not present

## 2017-11-13 DIAGNOSIS — F339 Major depressive disorder, recurrent, unspecified: Secondary | ICD-10-CM | POA: Diagnosis not present

## 2017-11-13 DIAGNOSIS — R29818 Other symptoms and signs involving the nervous system: Secondary | ICD-10-CM | POA: Diagnosis not present

## 2017-11-13 DIAGNOSIS — M6282 Rhabdomyolysis: Secondary | ICD-10-CM | POA: Diagnosis not present

## 2017-11-13 DIAGNOSIS — R413 Other amnesia: Secondary | ICD-10-CM | POA: Diagnosis not present

## 2017-11-13 DIAGNOSIS — G253 Myoclonus: Secondary | ICD-10-CM | POA: Diagnosis not present

## 2017-11-13 DIAGNOSIS — N179 Acute kidney failure, unspecified: Secondary | ICD-10-CM | POA: Diagnosis not present

## 2017-11-13 DIAGNOSIS — G9341 Metabolic encephalopathy: Secondary | ICD-10-CM | POA: Diagnosis not present

## 2017-11-13 DIAGNOSIS — R0603 Acute respiratory distress: Secondary | ICD-10-CM | POA: Diagnosis not present

## 2017-11-13 DIAGNOSIS — E46 Unspecified protein-calorie malnutrition: Secondary | ICD-10-CM | POA: Diagnosis not present

## 2017-11-13 DIAGNOSIS — R918 Other nonspecific abnormal finding of lung field: Secondary | ICD-10-CM | POA: Diagnosis not present

## 2017-11-13 DIAGNOSIS — J9690 Respiratory failure, unspecified, unspecified whether with hypoxia or hypercapnia: Secondary | ICD-10-CM | POA: Diagnosis not present

## 2017-11-13 DIAGNOSIS — F419 Anxiety disorder, unspecified: Secondary | ICD-10-CM | POA: Diagnosis not present

## 2017-11-13 DIAGNOSIS — J9692 Respiratory failure, unspecified with hypercapnia: Secondary | ICD-10-CM | POA: Diagnosis not present

## 2017-11-13 DIAGNOSIS — G92 Toxic encephalopathy: Secondary | ICD-10-CM | POA: Diagnosis not present

## 2017-11-13 DIAGNOSIS — R930 Abnormal findings on diagnostic imaging of skull and head, not elsewhere classified: Secondary | ICD-10-CM | POA: Diagnosis not present

## 2017-11-23 DIAGNOSIS — Z6828 Body mass index (BMI) 28.0-28.9, adult: Secondary | ICD-10-CM | POA: Diagnosis not present

## 2017-11-23 DIAGNOSIS — I10 Essential (primary) hypertension: Secondary | ICD-10-CM | POA: Diagnosis not present

## 2017-11-23 DIAGNOSIS — E119 Type 2 diabetes mellitus without complications: Secondary | ICD-10-CM | POA: Diagnosis not present

## 2017-11-23 DIAGNOSIS — E782 Mixed hyperlipidemia: Secondary | ICD-10-CM | POA: Diagnosis not present

## 2017-11-23 DIAGNOSIS — R69 Illness, unspecified: Secondary | ICD-10-CM | POA: Diagnosis not present

## 2017-11-25 DIAGNOSIS — Z79891 Long term (current) use of opiate analgesic: Secondary | ICD-10-CM | POA: Diagnosis not present

## 2017-12-07 DIAGNOSIS — R112 Nausea with vomiting, unspecified: Secondary | ICD-10-CM | POA: Diagnosis not present

## 2017-12-07 DIAGNOSIS — N390 Urinary tract infection, site not specified: Secondary | ICD-10-CM | POA: Diagnosis not present

## 2017-12-07 DIAGNOSIS — Z8614 Personal history of Methicillin resistant Staphylococcus aureus infection: Secondary | ICD-10-CM | POA: Diagnosis not present

## 2017-12-07 DIAGNOSIS — R5381 Other malaise: Secondary | ICD-10-CM | POA: Diagnosis not present

## 2017-12-07 DIAGNOSIS — Z79899 Other long term (current) drug therapy: Secondary | ICD-10-CM | POA: Diagnosis not present

## 2017-12-07 DIAGNOSIS — E876 Hypokalemia: Secondary | ICD-10-CM | POA: Diagnosis not present

## 2017-12-07 DIAGNOSIS — R69 Illness, unspecified: Secondary | ICD-10-CM | POA: Diagnosis not present

## 2017-12-07 DIAGNOSIS — Z7984 Long term (current) use of oral hypoglycemic drugs: Secondary | ICD-10-CM | POA: Diagnosis not present

## 2017-12-07 DIAGNOSIS — R51 Headache: Secondary | ICD-10-CM | POA: Diagnosis not present

## 2018-01-14 DIAGNOSIS — Z9889 Other specified postprocedural states: Secondary | ICD-10-CM | POA: Diagnosis not present

## 2018-01-14 DIAGNOSIS — R1011 Right upper quadrant pain: Secondary | ICD-10-CM | POA: Diagnosis not present

## 2018-01-14 DIAGNOSIS — Z8614 Personal history of Methicillin resistant Staphylococcus aureus infection: Secondary | ICD-10-CM | POA: Diagnosis not present

## 2018-01-14 DIAGNOSIS — Z8759 Personal history of other complications of pregnancy, childbirth and the puerperium: Secondary | ICD-10-CM | POA: Diagnosis not present

## 2018-01-14 DIAGNOSIS — E119 Type 2 diabetes mellitus without complications: Secondary | ICD-10-CM | POA: Diagnosis not present

## 2018-01-14 DIAGNOSIS — E785 Hyperlipidemia, unspecified: Secondary | ICD-10-CM | POA: Diagnosis not present

## 2018-01-14 DIAGNOSIS — R1012 Left upper quadrant pain: Secondary | ICD-10-CM | POA: Diagnosis not present

## 2018-01-14 DIAGNOSIS — Z90710 Acquired absence of both cervix and uterus: Secondary | ICD-10-CM | POA: Diagnosis not present

## 2018-01-14 DIAGNOSIS — R109 Unspecified abdominal pain: Secondary | ICD-10-CM | POA: Diagnosis not present

## 2018-01-14 DIAGNOSIS — K859 Acute pancreatitis without necrosis or infection, unspecified: Secondary | ICD-10-CM | POA: Diagnosis not present

## 2018-01-14 DIAGNOSIS — R69 Illness, unspecified: Secondary | ICD-10-CM | POA: Diagnosis not present

## 2018-01-14 DIAGNOSIS — Z9049 Acquired absence of other specified parts of digestive tract: Secondary | ICD-10-CM | POA: Diagnosis not present

## 2018-01-14 DIAGNOSIS — K5900 Constipation, unspecified: Secondary | ICD-10-CM | POA: Diagnosis not present

## 2018-01-24 DIAGNOSIS — Z79891 Long term (current) use of opiate analgesic: Secondary | ICD-10-CM | POA: Diagnosis not present

## 2018-02-23 DIAGNOSIS — I10 Essential (primary) hypertension: Secondary | ICD-10-CM | POA: Diagnosis not present

## 2018-02-23 DIAGNOSIS — E119 Type 2 diabetes mellitus without complications: Secondary | ICD-10-CM | POA: Diagnosis not present

## 2018-02-23 DIAGNOSIS — Z6829 Body mass index (BMI) 29.0-29.9, adult: Secondary | ICD-10-CM | POA: Diagnosis not present

## 2018-02-23 DIAGNOSIS — N3040 Irradiation cystitis without hematuria: Secondary | ICD-10-CM | POA: Diagnosis not present

## 2018-02-23 DIAGNOSIS — G4739 Other sleep apnea: Secondary | ICD-10-CM | POA: Diagnosis not present

## 2018-02-23 DIAGNOSIS — E782 Mixed hyperlipidemia: Secondary | ICD-10-CM | POA: Diagnosis not present

## 2018-02-23 DIAGNOSIS — Z6828 Body mass index (BMI) 28.0-28.9, adult: Secondary | ICD-10-CM | POA: Diagnosis not present

## 2018-02-23 DIAGNOSIS — R69 Illness, unspecified: Secondary | ICD-10-CM | POA: Diagnosis not present

## 2018-04-01 ENCOUNTER — Encounter: Payer: Self-pay | Admitting: Cardiovascular Disease

## 2018-04-27 ENCOUNTER — Encounter (INDEPENDENT_AMBULATORY_CARE_PROVIDER_SITE_OTHER): Payer: Self-pay

## 2018-04-27 ENCOUNTER — Encounter: Payer: Self-pay | Admitting: Cardiovascular Disease

## 2018-04-27 ENCOUNTER — Ambulatory Visit (INDEPENDENT_AMBULATORY_CARE_PROVIDER_SITE_OTHER): Payer: Medicare HMO | Admitting: Cardiovascular Disease

## 2018-04-27 VITALS — BP 124/76 | HR 82 | Ht 62.0 in | Wt 174.8 lb

## 2018-04-27 DIAGNOSIS — I10 Essential (primary) hypertension: Secondary | ICD-10-CM

## 2018-04-27 DIAGNOSIS — I2510 Atherosclerotic heart disease of native coronary artery without angina pectoris: Secondary | ICD-10-CM

## 2018-04-27 DIAGNOSIS — R0789 Other chest pain: Secondary | ICD-10-CM | POA: Diagnosis not present

## 2018-04-27 DIAGNOSIS — Z01812 Encounter for preprocedural laboratory examination: Secondary | ICD-10-CM

## 2018-04-27 MED ORDER — DILTIAZEM HCL 120 MG PO TABS: 120.0000 mg | ORAL_TABLET | Freq: Every day | ORAL | 3 refills | Status: AC
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
## 2018-04-27 NOTE — Patient Instructions (Addendum)
Your physician recommends that you continue on your current medications as directed. Please refer to the Current Medication list given to you today.   Your physician recommends that you return for lab work in:   BMET [place] has requested that you have an echocardiogram. Echocardiography is a painless test that uses sound waves to create images of your heart. It provides your doctor with information about the size and shape of your heart and how well your heart's chambers and valves are working. This procedure takes approximately one hour. There are no restrictions for this procedure.  Non-Cardiac CT Angiography (CTA), is a special type of CT scan that uses a computer to produce multi-dimensional views of major blood vessels throughout the body. In CT angiography, a contrast material is injected through an IV to help visualize the blood vessels .[place]  Your physician recommends that you schedule a follow-up appointment in:  Alyssa Gay

## 2018-04-28 ENCOUNTER — Other Ambulatory Visit: Payer: Self-pay | Admitting: Cardiovascular Disease

## 2018-04-28 ENCOUNTER — Telehealth: Payer: Self-pay | Admitting: Nurse Practitioner

## 2018-04-28 MED ORDER — METOPROLOL TARTRATE 50 MG PO TABS: ORAL_TABLET | ORAL | 0 refills | Status: DC

## 2018-04-28 NOTE — Telephone Encounter (Signed)
Called patient with instructions for Coronary CT. I advised her that Metoprolol 50 mg has been sent to her pharmacy and that we will need BMET within 7 days of the CT. I advised that if needed, we can order the BMET to be done at a [facility] close to her home because she lives in [place]. I advised I will follow-up with her once the CT is scheduled. She verbalized understanding and agreement and thanked me for the call.   Please arrive at the [facility] main entrance of [facility] at xx:xx AM (30-45 minutes prior to test start time)  [facility], [place] [address] [phone]  Proceed to the [facility] Radiology Department (First Floor).  Please follow these instructions carefully (unless otherwise directed):  Hold all erectile dysfunction medications at least 48 hours prior to test.  On the Night Before the Test: . Be sure to Drink plenty of water. . Do not consume any caffeinated/decaffeinated beverages or chocolate 12 hours prior to your test. . Do not take any antihistamines 12 hours prior to your test. . If you take Metformin do not take 24 hours prior to test.   On the Day of the Test: . Drink plenty of water. Do not drink any water within one hour of the test. . Do not eat any food 4 hours prior to the test. . You may take your regular medications prior to the test.  . Take metoprolol (Lopressor) two hours prior to test.       After the Test: . Drink plenty of water. . After receiving IV contrast, you may experience a mild flushed feeling. This is normal. . On occasion, you may experience a mild rash up to 24 hours after the test. This is not dangerous. If this occurs, you can take Benadryl 25 mg and increase your fluid intake. . If you experience trouble breathing, this can be serious. If it is severe call 911 IMMEDIATELY. If it is mild, please call our office. . If you take any of these medications: Glipizide/Metformin, Avandament,  Glucavance, please do not take 48 hours after completing test.

## 2018-04-28 NOTE — Addendum Note (Signed)
Addended by: Emmaline Life on: 04/28/2018 03:30 PM   Modules accepted: Orders

## 2018-05-03 ENCOUNTER — Other Ambulatory Visit: Payer: Self-pay

## 2018-05-03 ENCOUNTER — Telehealth: Payer: Self-pay | Admitting: Cardiovascular Disease

## 2018-05-03 ENCOUNTER — Ambulatory Visit (HOSPITAL_COMMUNITY): Payer: Medicare HMO | Attending: Cardiology

## 2018-05-03 DIAGNOSIS — R0789 Other chest pain: Secondary | ICD-10-CM | POA: Diagnosis present

## 2018-05-03 NOTE — Telephone Encounter (Signed)
Per Smith Robert. In [facility], patient is coming for Echo at our office today and requests to pick up a copy of instructions for Coronary CT. I advised Ivin Booty that I have advised patient that lab for BMET can be ordered to be done closer to patient's home if needed at appropriate time.

## 2018-05-03 NOTE — Telephone Encounter (Signed)
New Message    Pt is calling about questions she has to her tests today. Please call

## 2018-05-09 NOTE — Telephone Encounter (Signed)
New message    Called patient she wanted to know if they we received the authorization for cardiac Ct , I told her we were still waiting on the authorization and she said that she was told she might have to have a heart cath? She would like more information on when that needs to be done,.
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
## 2018-05-13 DIAGNOSIS — Z79899 Other long term (current) drug therapy: Secondary | ICD-10-CM | POA: Diagnosis not present

## 2018-05-13 DIAGNOSIS — G4710 Hypersomnia, unspecified: Secondary | ICD-10-CM | POA: Diagnosis not present

## 2018-05-13 DIAGNOSIS — R0902 Hypoxemia: Secondary | ICD-10-CM | POA: Diagnosis not present

## 2018-05-13 DIAGNOSIS — G4719 Other hypersomnia: Secondary | ICD-10-CM | POA: Diagnosis not present

## 2018-05-13 DIAGNOSIS — R0683 Snoring: Secondary | ICD-10-CM | POA: Diagnosis not present

## 2018-05-13 DIAGNOSIS — Z79891 Long term (current) use of opiate analgesic: Secondary | ICD-10-CM | POA: Diagnosis not present

## 2018-05-13 DIAGNOSIS — Z7984 Long term (current) use of oral hypoglycemic drugs: Secondary | ICD-10-CM | POA: Diagnosis not present

## 2018-05-17 DIAGNOSIS — Z7984 Long term (current) use of oral hypoglycemic drugs: Secondary | ICD-10-CM | POA: Diagnosis not present

## 2018-05-17 DIAGNOSIS — E119 Type 2 diabetes mellitus without complications: Secondary | ICD-10-CM | POA: Diagnosis not present

## 2018-05-17 DIAGNOSIS — G2581 Restless legs syndrome: Secondary | ICD-10-CM | POA: Diagnosis not present

## 2018-05-17 DIAGNOSIS — I10 Essential (primary) hypertension: Secondary | ICD-10-CM | POA: Diagnosis not present

## 2018-05-17 DIAGNOSIS — J309 Allergic rhinitis, unspecified: Secondary | ICD-10-CM | POA: Diagnosis not present

## 2018-05-17 DIAGNOSIS — G8929 Other chronic pain: Secondary | ICD-10-CM | POA: Diagnosis not present

## 2018-05-17 DIAGNOSIS — Z7722 Contact with and (suspected) exposure to environmental tobacco smoke (acute) (chronic): Secondary | ICD-10-CM | POA: Diagnosis not present

## 2018-05-17 DIAGNOSIS — E785 Hyperlipidemia, unspecified: Secondary | ICD-10-CM | POA: Diagnosis not present

## 2018-05-17 DIAGNOSIS — K219 Gastro-esophageal reflux disease without esophagitis: Secondary | ICD-10-CM | POA: Diagnosis not present

## 2018-05-17 DIAGNOSIS — R110 Nausea: Secondary | ICD-10-CM | POA: Diagnosis not present

## 2018-05-18 ENCOUNTER — Other Ambulatory Visit: Payer: Medicare HMO | Admitting: *Deleted

## 2018-05-18 DIAGNOSIS — Z01812 Encounter for preprocedural laboratory examination: Secondary | ICD-10-CM

## 2018-05-18 DIAGNOSIS — K439 Ventral hernia without obstruction or gangrene: Secondary | ICD-10-CM | POA: Diagnosis not present

## 2018-05-19 LAB — BASIC METABOLIC PANEL
BUN/Creatinine Ratio: 10 (ref 9–23)
BUN: 7 mg/dL (ref 6–24)
CALCIUM: 9.1 mg/dL (ref 8.7–10.2)
CHLORIDE: 100 mmol/L (ref 96–106)
CO2: 23 mmol/L (ref 20–29)
Creatinine, Ser: 0.67 mg/dL (ref 0.57–1.00)
GFR calc non Af Amer: 101 mL/min/{1.73_m2} (ref >59)
GFR, EST AFRICAN AMERICAN: 116 mL/min/{1.73_m2} (ref >59)
GLUCOSE: 152 mg/dL — AB (ref 65–99)
POTASSIUM: 4 mmol/L (ref 3.5–5.2)
Sodium: 142 mmol/L (ref 134–144)

## 2018-05-24 ENCOUNTER — Telehealth (HOSPITAL_COMMUNITY): Payer: Self-pay | Admitting: Emergency Medicine

## 2018-05-24 NOTE — Telephone Encounter (Signed)
Reaching out to patient to offer assistance regarding upcoming cardiac imaging study; pt verbalizes understanding of appt date/time, parking situation and where to check in, pre-test NPO status and medications ordered, and verified current allergies; name and call back number provided for further questions should they arise [name] RN Navigator Cardiac Imaging [facility] [phone] office [phone] cell 

## 2018-05-26 ENCOUNTER — Ambulatory Visit (HOSPITAL_COMMUNITY)
Admission: RE | Admit: 2018-05-26 | Discharge: 2018-05-26 | Disposition: A | Discharge status: Home / Self Care | Payer: Medicare HMO | Source: Ambulatory Visit | Attending: Cardiovascular Disease | Admitting: Cardiovascular Disease

## 2018-05-26 ENCOUNTER — Ambulatory Visit (HOSPITAL_COMMUNITY): Admission: RE | Admit: 2018-05-26 | Payer: Medicare HMO | Source: Ambulatory Visit

## 2018-05-26 DIAGNOSIS — I2510 Atherosclerotic heart disease of native coronary artery without angina pectoris: Secondary | ICD-10-CM

## 2018-05-26 DIAGNOSIS — R0789 Other chest pain: Secondary | ICD-10-CM | POA: Diagnosis not present

## 2018-05-26 MED ORDER — NITROGLYCERIN 0.4 MG SL SUBL
0.8000 mg | SUBLINGUAL_TABLET | Freq: Once | SUBLINGUAL | Status: AC
  Administered 2018-05-26: 0.8 mg via SUBLINGUAL
  Filled 2018-05-26: qty 25

## 2018-05-26 MED ORDER — METOPROLOL TARTRATE 5 MG/5ML IV SOLN
5.0000 mg | INTRAVENOUS | Status: DC | PRN
Start: 2018-05-26 — End: 2018-05-27
  Administered 2018-05-26: 5 mg via INTRAVENOUS
  Filled 2018-05-26 (×2): qty 5

## 2018-05-26 MED ORDER — NITROGLYCERIN 0.4 MG SL SUBL
SUBLINGUAL_TABLET | SUBLINGUAL | Status: AC
  Administered 2018-05-26: 0.8 mg via SUBLINGUAL
  Filled 2018-05-26: qty 2

## 2018-05-26 MED ORDER — IOPAMIDOL (ISOVUE-370) INJECTION 76%
80.0000 mL | Freq: Once | INTRAVENOUS | Status: AC | PRN
  Administered 2018-05-26: 80 mL via INTRAVENOUS

## 2018-05-26 MED ORDER — METOPROLOL TARTRATE 5 MG/5ML IV SOLN
INTRAVENOUS | Status: AC
  Administered 2018-05-26: 5 mg via INTRAVENOUS
  Filled 2018-05-26: qty 5

## 2018-05-30 ENCOUNTER — Telehealth: Payer: Self-pay | Admitting: Cardiovascular Disease

## 2018-05-30 MED ORDER — FENOFIBRATE 145 MG PO TABS: 145.0000 mg | ORAL_TABLET | Freq: Every day | ORAL | 11 refills | Status: DC

## 2018-05-30 NOTE — Telephone Encounter (Signed)
New Message:   Pt would like to know if her CT results are ready, she had it on 05-26-18.

## 2018-05-30 NOTE — Telephone Encounter (Signed)
Reviewed coronary CT results with patient who verbalized understanding. I answered questions to her satisfaction. She agrees to start fenofibrate 145 mg daily. She is scheduled to see Dr. Acie Fredrickson 4/14 and will come fasting to that appointment for follow-up lab work. She thanked me for the call.

## 2018-06-02 DIAGNOSIS — N3040 Irradiation cystitis without hematuria: Secondary | ICD-10-CM | POA: Diagnosis not present

## 2018-06-02 DIAGNOSIS — R69 Illness, unspecified: Secondary | ICD-10-CM | POA: Diagnosis not present

## 2018-06-02 DIAGNOSIS — K210 Gastro-esophageal reflux disease with esophagitis: Secondary | ICD-10-CM | POA: Diagnosis not present

## 2018-06-02 DIAGNOSIS — E782 Mixed hyperlipidemia: Secondary | ICD-10-CM | POA: Diagnosis not present

## 2018-06-02 DIAGNOSIS — I10 Essential (primary) hypertension: Secondary | ICD-10-CM | POA: Diagnosis not present

## 2018-06-02 DIAGNOSIS — Z6830 Body mass index (BMI) 30.0-30.9, adult: Secondary | ICD-10-CM | POA: Diagnosis not present

## 2018-06-02 DIAGNOSIS — G4739 Other sleep apnea: Secondary | ICD-10-CM | POA: Diagnosis not present

## 2018-06-02 DIAGNOSIS — E119 Type 2 diabetes mellitus without complications: Secondary | ICD-10-CM | POA: Diagnosis not present

## 2018-06-14 DIAGNOSIS — K439 Ventral hernia without obstruction or gangrene: Secondary | ICD-10-CM | POA: Diagnosis not present

## 2018-07-21 ENCOUNTER — Telehealth: Payer: Self-pay

## 2018-07-21 NOTE — Telephone Encounter (Signed)
Pt has given consent for her virtual visit with Dr. Acie Fredrickson on 4/14 at 1pm. She will have her HR and weight ready for the visit.   YOUR CARDIOLOGY TEAM HAS ARRANGED FOR AN E-VISIT FOR YOUR APPOINTMENT - PLEASE REVIEW IMPORTANT INFORMATION BELOW SEVERAL DAYS PRIOR TO YOUR APPOINTMENT  Due to the recent COVID-19 pandemic, we are transitioning in-person office visits to tele-medicine visits in an effort to decrease unnecessary exposure to our patients and staff. Medicare and most insurances are covering these visits without a copay needed. You will need a working email and a smartphone or computer with a camera and microphone. For patients that do not have these items, we can still complete the visit using a telephone but do prefer video when possible. If possible, we also ask that you have a blood pressure cuff and scale at home to measure your blood pressure, heart rate and weight prior to your scheduled appointment. Patients with clinical needs that need an in-person evaluation and testing will still be able to come to the office if absolutely necessary. If you have any questions, feel free to call our office.     DOWNLOADING THE SOFTWARE  Download the [employer] app to enable video and telephone visits with your [facility] Provider.   Instructions for downloading [employer] WebEx: - Go to [email or web address]://[email or web address].[email or web address].[email or web address] and follow the instructions, or download the app on your smartphone [facility] [employer] Meetings). - If you have technical difficulties with downloading WebEx, please call WebEx at [phone]. - Once the app is downloaded (can be done on either mobile or desktop computer), go to Settings in the upper left hand corner.  Be sure that camera and audio are enabled.  - You will receive an email message with a link to the meeting with a time to join for your tele-health visit.  - Please download the app and have settings configured prior to the appointment time.      2-3 DAYS BEFORE  YOUR APPOINTMENT  One of our staff will call you to confirm that you have been able to set up your WebEx account. We will remind you check your blood pressure, heart rate and weight prior to your scheduled appointment. If you have an Apple Watch or Kardia, please upload any pertinent ECG strips the day before or morning of your appointment to [place]. Our staff will also make sure you have reviewed the consent and agree to move forward with your scheduled tele-health visit.    THE DAY OF YOUR APPOINTMENT  Approximately 15-20 minutes prior to your scheduled appointment, you will receive an e-mail directly from one of our staff member's [email or web address] e-mail accounts inviting you to join a WebEx meeting.  Please do not reply to that email - simply join the [employer].  Upon joining, a member of the office staff will speak with you initially through the WebEx platform to confirm medications, vital signs for the day and any symptoms you may be experiencing.  Please have this information available prior to the time of visit start.      CONSENT FOR TELE-HEALTH VISIT - PLEASE RVIEW  I hereby voluntarily request, consent and authorize [facility] and its employed or contracted physicians, physician assistants, nurse practitioners or other licensed health care professionals (the Practitioner), to provide me with telemedicine health care services (the "Services") as deemed necessary by the treating Practitioner. I acknowledge and consent to receive the Services by the Practitioner via telemedicine. I understand that the telemedicine visit will involve communicating  with the Practitioner through live audiovisual communication technology and the disclosure of certain medical information by electronic transmission. I acknowledge that I have been given the opportunity to request an in-person assessment or other available alternative prior to the telemedicine visit and am voluntarily participating in the  telemedicine visit.  I understand that I have the right to withhold or withdraw my consent to the use of telemedicine in the course of my care at any time, without affecting my right to future care or treatment, and that the Practitioner or I may terminate the telemedicine visit at any time. I understand that I have the right to inspect all information obtained and/or recorded in the course of the telemedicine visit and may receive copies of available information for a reasonable fee.  I understand that some of the potential risks of receiving the Services via telemedicine include:  Marland Kitchen Delay or interruption in medical evaluation due to technological equipment failure or disruption; . Information transmitted may not be sufficient (e.g. poor resolution of images) to allow for appropriate medical decision making by the Practitioner; and/or  . In rare instances, security protocols could fail, causing a breach of personal health information.  Furthermore, I acknowledge that it is my responsibility to provide information about my medical history, conditions and care that is complete and accurate to the best of my ability. I acknowledge that Practitioner's advice, recommendations, and/or decision may be based on factors not within their control, such as incomplete or inaccurate data provided by me or distortions of diagnostic images or specimens that may result from electronic transmissions. I understand that the practice of medicine is not an exact science and that Practitioner makes no warranties or guarantees regarding treatment outcomes. I acknowledge that I will receive a copy of this consent concurrently upon execution via email to the email address I last provided but may also request a printed copy by calling the office of [place].    I understand that my insurance will be billed for this visit.   I have read or had this consent read to me. . I understand the contents of this consent, which  adequately explains the benefits and risks of the Services being provided via telemedicine.  . I have been provided ample opportunity to ask questions regarding this consent and the Services and have had my questions answered to my satisfaction. . I give my informed consent for the services to be provided through the use of telemedicine in my medical care  By participating in this telemedicine visit I agree to the above.

## 2018-08-01 ENCOUNTER — Telehealth: Payer: Self-pay | Admitting: Cardiovascular Disease

## 2018-08-01 NOTE — Telephone Encounter (Signed)
Left message for patient to return my call regarding her virtual visit 08/02/2018.

## 2018-08-02 ENCOUNTER — Other Ambulatory Visit: Payer: Self-pay

## 2018-08-02 ENCOUNTER — Telehealth: Payer: Medicare HMO | Admitting: Cardiovascular Disease

## 2018-08-02 ENCOUNTER — Telehealth: Payer: Self-pay | Admitting: Nurse Practitioner

## 2018-08-02 DIAGNOSIS — Z7189 Other specified counseling: Secondary | ICD-10-CM

## 2018-08-02 NOTE — Progress Notes (Deleted)
{Choose 1 Note Type (Telehealth Visit or Telephone Visit):[id number]}   Evaluation Performed:  Follow-up visit  Date:  08/02/2018   ID:  Alyssa Gay, DOB 01/27/1966, MRN [id number]  Patient Location: Home  Provider Location: Home  PCP:  Neale Burly, MD  Cardiologist:  No primary care provider on file.  Electrophysiologist:  None   Chief Complaint:  HTN, hyperlipidemia,   History of Present Illness:    Alyssa Gay is a 53 y.o. female who presents via audio/video conferencing for a telehealth visit today.    ***  The patient {does/does not:200015} have symptoms concerning for COVID-19 infection (fever, chills, cough, or new shortness of breath).    Past Medical History:  Diagnosis Date  . Anxiety   . Depression   . Diabetes mellitus ([place])   . GERD (gastroesophageal reflux disease)   . Hyperlipidemia   . Hypertension   . 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 Sleep apnea    Past Surgical History:  Procedure Laterality Date  . APPENDECTOMY    . BACK SURGERY     x2  . CESAREAN SECTION     x3  . GALLBLADDER SURGERY    . UMBILICAL HERNIA REPAIR     REACTION TO THE MESH     No outpatient medications have been marked as taking for the 08/02/18 encounter (Appointment) with Simrah Chatham, Wonda Cheng, MD.     Allergies:   Patient has no known allergies.   Social History   Tobacco Use  . Smoking status: Former Research scientist (life sciences)  . Smokeless tobacco: Never Used  Substance Use Topics  . Alcohol use: Never    Frequency: Never  . Drug use: Never     Family Hx: The patient's family history includes Colon cancer in her mother; Congestive Heart Failure in her paternal grandmother; Heart Problems in her paternal grandfather; Heart attack in her father; Hyperlipidemia in her brother; Hypertension in her brother; Hypertension (age of onset: 83) in her paternal grandfather; Prostate cancer in her father.  ROS:   Please see the history of present illness.    *** All other  systems reviewed and are negative.   Prior CV studies:   The following studies were reviewed today:  ***  Labs/Other Tests and Data Reviewed:    EKG:  {ESP:2330076226}  Recent Labs: 05/18/2018: BUN 7; Creatinine, Ser 0.67; Potassium 4.0; Sodium 142   Recent Lipid Panel No results found for: CHOL, TRIG, HDL, CHOLHDL, LDLCALC, LDLDIRECT  Wt Readings from Last 3 Encounters:  04/27/18 174 lb 12.8 oz (79.3 kg)     Objective:    Vital Signs:  There were no vitals taken for this visit.   Well nourished, well developed female in no*** acute distress. ***  ASSESSMENT & PLAN:    1. ***  COVID-19 Education: The signs and symptoms of COVID-19 were discussed with the patient and how to seek care for testing (follow up with PCP or arrange E-visit).  ***The importance of social distancing was discussed today.  Time:   Today, I have spent *** minutes with the patient with telehealth technology discussing the above problems.     Medication Adjustments/Labs and Tests Ordered: Current medicines are reviewed at length with the patient today.  Concerns regarding medicines are outlined above.   Tests Ordered: No orders of the defined types were placed in this encounter.   Medication Changes: No orders of the defined types were placed in this encounter.   Disposition:  Follow up {follow up:15908}  Signed, Mertie Moores, MD  08/02/2018 12:48 PM    [place]

## 2018-08-02 NOTE — Telephone Encounter (Signed)
At the time of the patient's appointment CMA was unable to reach patient and after several attempts, she left the patient a message. I called the patient again and left another message requesting that the patient call back to reschedule

## 2018-08-04 NOTE — Progress Notes (Signed)
Pt did not show for virtual visit

## 2018-09-28 DIAGNOSIS — E119 Type 2 diabetes mellitus without complications: Secondary | ICD-10-CM | POA: Diagnosis not present

## 2018-09-28 DIAGNOSIS — K432 Incisional hernia without obstruction or gangrene: Secondary | ICD-10-CM | POA: Diagnosis not present

## 2018-10-11 DIAGNOSIS — R69 Illness, unspecified: Secondary | ICD-10-CM | POA: Diagnosis not present

## 2018-10-11 DIAGNOSIS — K432 Incisional hernia without obstruction or gangrene: Secondary | ICD-10-CM | POA: Diagnosis not present

## 2018-10-11 DIAGNOSIS — E119 Type 2 diabetes mellitus without complications: Secondary | ICD-10-CM | POA: Diagnosis not present

## 2019-08-30 ENCOUNTER — Ambulatory Visit (INDEPENDENT_AMBULATORY_CARE_PROVIDER_SITE_OTHER): Payer: Medicare HMO | Admitting: Cardiovascular Disease

## 2019-08-30 ENCOUNTER — Encounter: Payer: Self-pay | Admitting: Cardiovascular Disease

## 2019-08-30 ENCOUNTER — Other Ambulatory Visit: Payer: Self-pay

## 2019-08-30 VITALS — BP 124/62 | HR 80 | Ht 62.0 in | Wt 162.0 lb

## 2019-08-30 DIAGNOSIS — R0789 Other chest pain: Secondary | ICD-10-CM | POA: Diagnosis not present

## 2019-08-30 DIAGNOSIS — I5032 Chronic diastolic (congestive) heart failure: Secondary | ICD-10-CM

## 2019-08-30 DIAGNOSIS — R931 Abnormal findings on diagnostic imaging of heart and coronary circulation: Secondary | ICD-10-CM | POA: Diagnosis not present

## 2019-08-30 DIAGNOSIS — E782 Mixed hyperlipidemia: Secondary | ICD-10-CM

## 2019-08-30 DIAGNOSIS — R079 Chest pain, unspecified: Secondary | ICD-10-CM | POA: Insufficient documentation

## 2019-08-30 DIAGNOSIS — E785 Hyperlipidemia, unspecified: Secondary | ICD-10-CM | POA: Insufficient documentation

## 2019-08-30 NOTE — Patient Instructions (Addendum)
Medication Instructions:  Your physician recommends that you continue on your current medications as directed. Please refer to the Current Medication list given to you today.  *If you need a refill on your cardiac medications before your next appointment, please call your pharmacy*   Lab Work: Your physician recommends that you return for lab work in: 1-2 weeks You will need to FAST for this appointment - nothing to eat or drink after midnight the night before except water.  If you have labs (blood work) drawn today and your tests are completely normal, you will receive your results only by: Marland Kitchen MyChart Message (if you have MyChart) OR . A paper copy in the mail If you have any lab test that is abnormal or we need to change your treatment, we will call you to review the results.   Testing/Procedures: None Ordered   Follow-Up: At [facility], you and your health needs are our priority.  As part of our continuing mission to provide you with exceptional heart care, we have created designated Provider Care Teams.  These Care Teams include your primary Cardiologist (physician) and Advanced Practice Providers (APPs -  Physician Assistants and Nurse Practitioners) who all work together to provide you with the care you need, when you need it.   Your next appointment:   1 year(s)  The format for your next appointment:   In Person  Provider:   You may see Mertie Moores, MD or one of the following Advanced Practice Providers on your designated Care Team:    Richardson Dopp, PA-C  [place], [place]
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
## 2019-09-04 ENCOUNTER — Other Ambulatory Visit: Payer: Medicare HMO

## 2019-09-12 IMAGING — CT CT HEART MORP W/ CTA COR W/ SCORE W/ CA W/CM &/OR W/O CM
4 of 7 series · 8 of 20 positions shown, 9 images · IV contrast (APPLIED)
Comparison: None.

Addendum:
EXAM:
OVER-READ INTERPRETATION  CT CHEST

The following report is an over-read performed by radiologist Dr.
Benrabah Etoil [REDACTED] on 05/27/2018. This
over-read does not include interpretation of cardiac or coronary
anatomy or pathology. The coronary CTA interpretation by the
cardiologist is attached.
CLINICAL DATA: Chest pain
Cardiac CTA
MEDICATIONS:
Sub lingual nitro. 4mg x 2
TECHNIQUE: The patient was scanned on a Siemens [REDACTED]ice scanner. Gantry
rotation speed was 250 msecs. Collimation was 0.6 mm. A 100 kV
prospective scan was triggered in the ascending thoracic aorta at
35-75% of the R-R interval. Average HR during the scan was 60 bpm.
The 3D data set was interpreted on a dedicated work station using
MPR, MIP and VRT modes. A total of 80cc of contrast was used.

[Series 7: best diast 78 % · axial · 0.33mm/px · z∈[+1055,+1098]mm · 2 of 326 slices shown, 3 images]
[im 109/326  vessel]
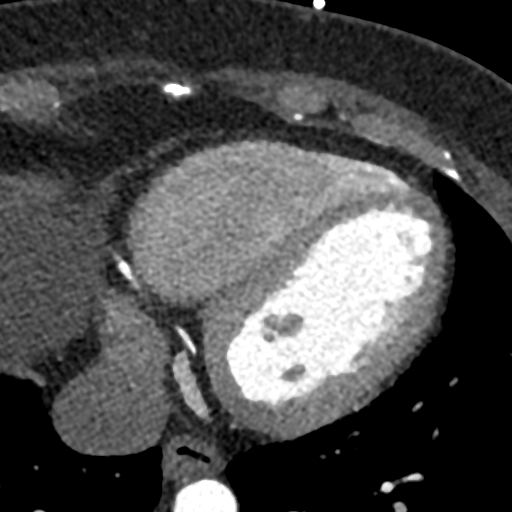
[im 109/326  lung]
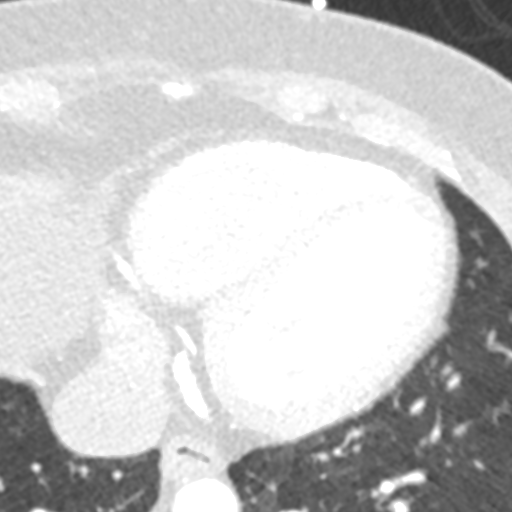
[im 217/326  vessel]
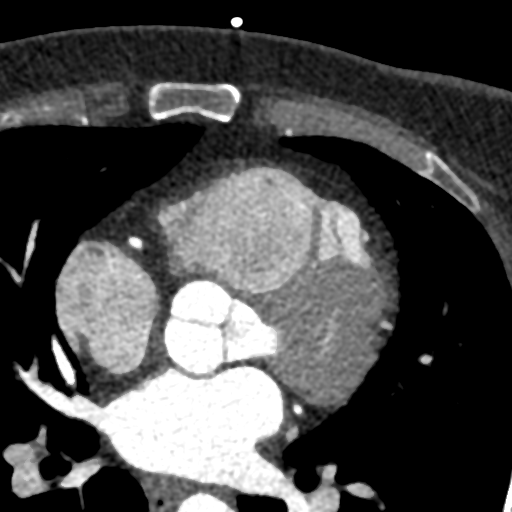

[Series 8: best syst 38 % · axial · 0.33mm/px · z∈[+1055,+1098]mm · 2 of 326 slices shown]
[im 109/326  vessel]
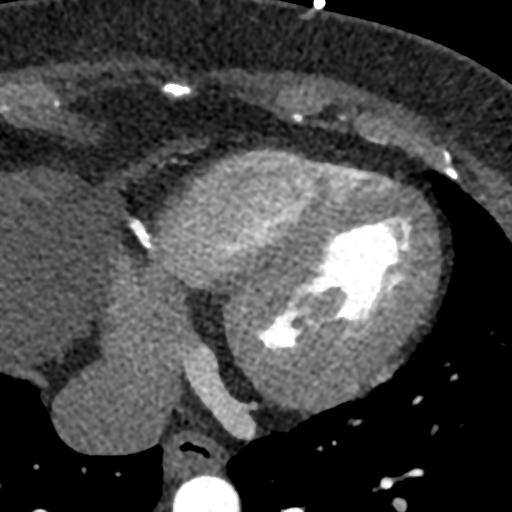
[im 217/326  vessel]
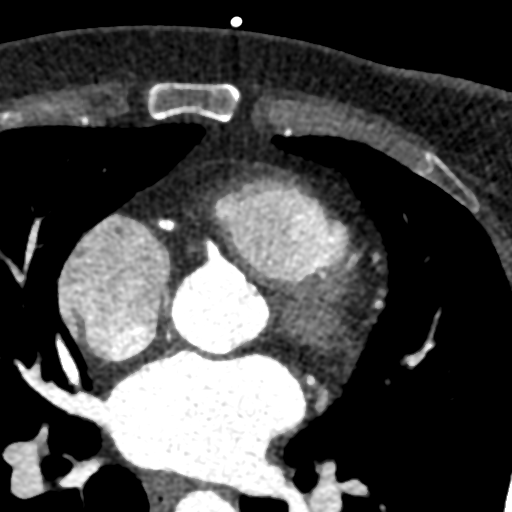

[Series 9: ts diast sharp 78 % · axial · 0.33mm/px · z∈[+1055,+1098]mm · 2 of 326 slices shown]
[im 109/326  lung]
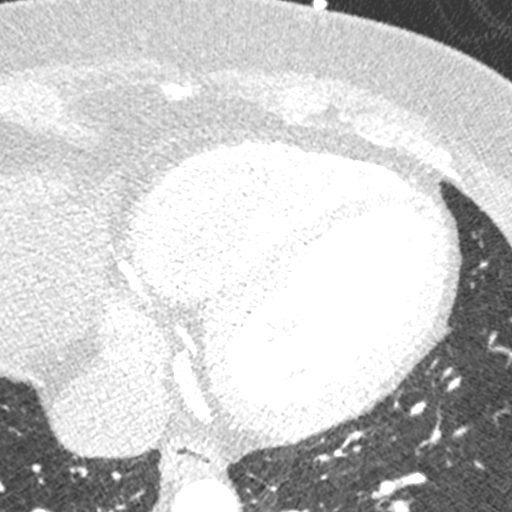
[im 217/326  lung]
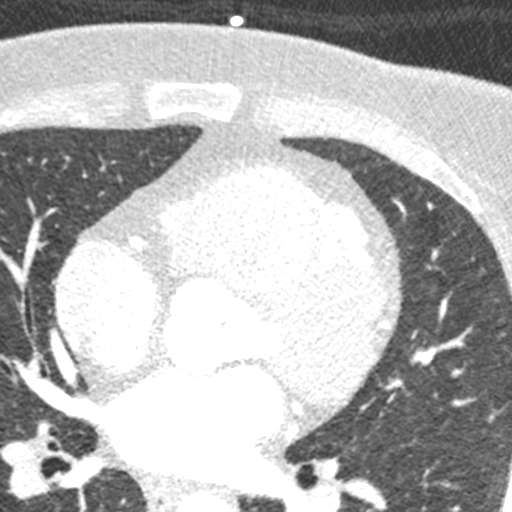

[Series 10: ts syst sharp 38 % · axial · 0.33mm/px · z∈[+1055,+1098]mm · 2 of 326 slices shown]
[im 109/326  lung]
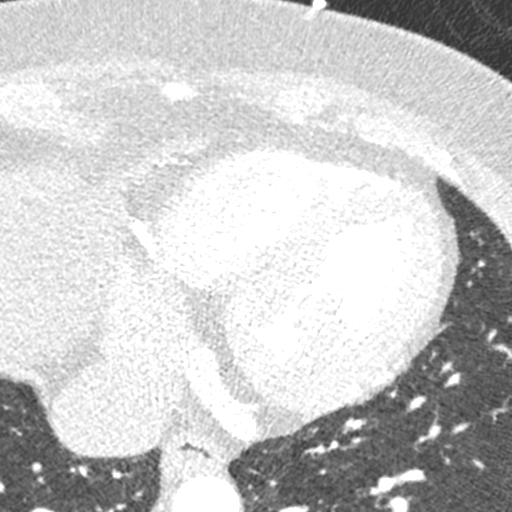
[im 217/326  lung]
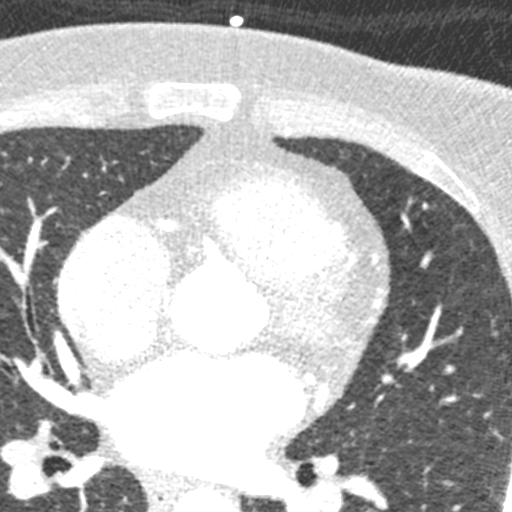

[8 of 20 positions shown; findings below may reference images not displayed]

FINDINGS: Limited view of the lung parenchyma demonstrates no suspicious
nodularity. Airways are normal.

Limited view of the mediastinum demonstrates no adenopathy.
Esophagus normal.

Limited view of the upper abdomen unremarkable.

Limited view of the skeleton and chest wall is unremarkable.
IMPRESSION: No significant extracardiac findings.
FINDINGS: Non-cardiac: See separate report from [REDACTED].

Pulmonary veins drain normally to the left atrium.

Calcium Score: 61 Agatston units.

Coronary Arteries: Right dominant with no anomalies

LM: No plaque or stenosis.

LAD system: Relatively small caliber LAD with mixed plaque
proximally, no more than mild (<50%) stenosis.

Circumflex system: Moderate ramus with mixed plaque, mild (<50%)
stenosis proximally. Mixed plaque in the mid LCx with mild (<50%)
stenosis.

RCA system: Mixed plaque with mild stenosis (<50%) in the proximal
and distal RCA.
IMPRESSION: 1. Coronary artery calcium score 61 Agatston units. This places the
patient in the 94th percentile for age and gender, suggesting high
risk for future cardiac events.

2.  Nonobstructive coronary disease.

Rodel Pomerantz

*** End of Addendum ***

## 2019-09-14 ENCOUNTER — Other Ambulatory Visit: Payer: Medicare HMO

## 2019-09-14 ENCOUNTER — Other Ambulatory Visit: Payer: Self-pay | Admitting: Nurse Practitioner

## 2019-09-14 DIAGNOSIS — R0789 Other chest pain: Secondary | ICD-10-CM

## 2019-09-14 DIAGNOSIS — E119 Type 2 diabetes mellitus without complications: Secondary | ICD-10-CM

## 2019-09-14 DIAGNOSIS — I5032 Chronic diastolic (congestive) heart failure: Secondary | ICD-10-CM

## 2019-09-14 DIAGNOSIS — R931 Abnormal findings on diagnostic imaging of heart and coronary circulation: Secondary | ICD-10-CM

## 2019-09-14 NOTE — Progress Notes (Signed)
a 

## 2019-09-15 LAB — LIPID PANEL
Chol/HDL Ratio: 5.4 ratio — ABNORMAL HIGH (ref 0.0–4.4)
Cholesterol, Total: 168 mg/dL (ref 100–199)
HDL: 31 mg/dL — ABNORMAL LOW (ref >39)
LDL Chol Calc (NIH): 104 mg/dL — ABNORMAL HIGH (ref 0–99)
Triglycerides: 186 mg/dL — ABNORMAL HIGH (ref 0–149)
VLDL Cholesterol Cal: 33 mg/dL (ref 5–40)

## 2019-09-15 LAB — HEPATIC FUNCTION PANEL
ALT: 38 IU/L — ABNORMAL HIGH (ref 0–32)
AST: 67 IU/L — ABNORMAL HIGH (ref 0–40)
Albumin: 4.9 g/dL (ref 3.8–4.9)
Alkaline Phosphatase: 118 IU/L (ref 48–121)
Bilirubin Total: 0.2 mg/dL (ref 0.0–1.2)
Bilirubin, Direct: 0.12 mg/dL (ref 0.00–0.40)
Total Protein: 7.6 g/dL (ref 6.0–8.5)

## 2019-09-15 LAB — BASIC METABOLIC PANEL
BUN/Creatinine Ratio: 13 (ref 9–23)
BUN: 11 mg/dL (ref 6–24)
CO2: 24 mmol/L (ref 20–29)
Calcium: 9.8 mg/dL (ref 8.7–10.2)
Chloride: 98 mmol/L (ref 96–106)
Creatinine, Ser: 0.83 mg/dL (ref 0.57–1.00)
GFR calc Af Amer: 92 mL/min/{1.73_m2} (ref >59)
GFR calc non Af Amer: 80 mL/min/{1.73_m2} (ref >59)
Glucose: 83 mg/dL (ref 65–99)
Potassium: 4.8 mmol/L (ref 3.5–5.2)
Sodium: 137 mmol/L (ref 134–144)

## 2019-09-15 LAB — HEMOGLOBIN A1C
Est. average glucose Bld gHb Est-mCnc: 177 mg/dL
Hgb A1c MFr Bld: 7.8 % — ABNORMAL HIGH (ref 4.8–5.6)

## 2019-09-21 ENCOUNTER — Telehealth: Payer: Self-pay | Admitting: Nurse Practitioner

## 2019-09-21 DIAGNOSIS — R931 Abnormal findings on diagnostic imaging of heart and coronary circulation: Secondary | ICD-10-CM

## 2019-09-21 DIAGNOSIS — E782 Mixed hyperlipidemia: Secondary | ICD-10-CM

## 2019-09-21 DIAGNOSIS — E119 Type 2 diabetes mellitus without complications: Secondary | ICD-10-CM

## 2019-09-21 MED ORDER — ROSUVASTATIN CALCIUM 10 MG PO TABS: 10.0000 mg | ORAL_TABLET | Freq: Every day | ORAL | 3 refills | Status: AC

## 2019-09-21 NOTE — Telephone Encounter (Signed)
Results and review sent through MyChart and message left for patient to call the office for details. Will send prescription for rosuvastatin and d/c patient's atorvastatin Rx.

## 2019-09-21 NOTE — Telephone Encounter (Signed)
-----   Message from Thayer Headings, MD sent at 09/15/2019 12:33 PM EDT ----- LDL is 104.   Her goal is < 70. Currently on Atorvastatin 20 mg a day  Lets DC atorvastatin Start rosuvastatin 10 mg a day .   Check lipids, liver enz, bmp in 3 months  Bmp is stable Liver enz are mildly elevated  HbA1C is mildly elevated - please send/ fax copy of these labs to her Primary MD ( Hasanaj)

## 2019-12-28 ENCOUNTER — Other Ambulatory Visit: Payer: Medicare HMO

## 2020-01-10 ENCOUNTER — Other Ambulatory Visit: Payer: Self-pay

## 2020-01-10 ENCOUNTER — Other Ambulatory Visit: Payer: Medicare HMO

## 2020-01-10 DIAGNOSIS — R931 Abnormal findings on diagnostic imaging of heart and coronary circulation: Secondary | ICD-10-CM

## 2020-01-10 DIAGNOSIS — E782 Mixed hyperlipidemia: Secondary | ICD-10-CM

## 2020-01-10 DIAGNOSIS — E119 Type 2 diabetes mellitus without complications: Secondary | ICD-10-CM

## 2020-01-10 LAB — BASIC METABOLIC PANEL
BUN/Creatinine Ratio: 11 (ref 9–23)
BUN: 8 mg/dL (ref 6–24)
CO2: 21 mmol/L (ref 20–29)
Calcium: 9.8 mg/dL (ref 8.7–10.2)
Chloride: 100 mmol/L (ref 96–106)
Creatinine, Ser: 0.76 mg/dL (ref 0.57–1.00)
GFR calc Af Amer: 103 mL/min/{1.73_m2} (ref >59)
GFR calc non Af Amer: 89 mL/min/{1.73_m2} (ref >59)
Glucose: 120 mg/dL — ABNORMAL HIGH (ref 65–99)
Potassium: 4.4 mmol/L (ref 3.5–5.2)
Sodium: 139 mmol/L (ref 134–144)

## 2020-01-10 LAB — LIPID PANEL
Chol/HDL Ratio: 3.8 ratio (ref 0.0–4.4)
Cholesterol, Total: 106 mg/dL (ref 100–199)
HDL: 28 mg/dL — ABNORMAL LOW (ref >39)
LDL Chol Calc (NIH): 47 mg/dL (ref 0–99)
Triglycerides: 185 mg/dL — ABNORMAL HIGH (ref 0–149)
VLDL Cholesterol Cal: 31 mg/dL (ref 5–40)

## 2020-01-10 LAB — HEPATIC FUNCTION PANEL
ALT: 28 IU/L (ref 0–32)
AST: 32 IU/L (ref 0–40)
Albumin: 4.7 g/dL (ref 3.8–4.9)
Alkaline Phosphatase: 114 IU/L (ref 44–121)
Bilirubin Total: <0.2 mg/dL (ref 0.0–1.2)
Bilirubin, Direct: 0.1 mg/dL (ref 0.00–0.40)
Total Protein: 7.5 g/dL (ref 6.0–8.5)

## 2020-10-30 ENCOUNTER — Ambulatory Visit: Payer: Medicare HMO | Admitting: Cardiovascular Disease

## 2020-11-06 ENCOUNTER — Other Ambulatory Visit: Payer: Self-pay

## 2020-11-06 ENCOUNTER — Encounter: Payer: Self-pay | Admitting: Cardiovascular Disease

## 2020-11-06 ENCOUNTER — Ambulatory Visit (INDEPENDENT_AMBULATORY_CARE_PROVIDER_SITE_OTHER): Payer: Medicare Other | Admitting: Cardiovascular Disease

## 2020-11-06 VITALS — BP 120/76 | HR 76 | Ht 62.5 in | Wt 158.8 lb

## 2020-11-06 DIAGNOSIS — E782 Mixed hyperlipidemia: Secondary | ICD-10-CM | POA: Diagnosis not present

## 2020-11-06 DIAGNOSIS — K047 Periapical abscess without sinus: Secondary | ICD-10-CM | POA: Diagnosis not present

## 2020-11-06 DIAGNOSIS — I5032 Chronic diastolic (congestive) heart failure: Secondary | ICD-10-CM

## 2020-11-06 DIAGNOSIS — R931 Abnormal findings on diagnostic imaging of heart and coronary circulation: Secondary | ICD-10-CM

## 2020-11-06 MED ORDER — AMOXICILLIN-POT CLAVULANATE 875-125 MG PO TABS: 1.0000 | ORAL_TABLET | Freq: Two times a day (BID) | ORAL | 0 refills | Status: AC

## 2020-11-06 NOTE — Patient Instructions (Addendum)
Medication Instructions:  Your physician has recommended you make the following change in your medication:   START Augmentin (Amoxicillin-Clavulanate) 875-125 mg twice daily for 10 days for your tooth abscess Contact your dentist ASAP for an appointment  *If you need a refill on your cardiac medications before your next appointment, please call your pharmacy*   Lab Work: None Ordered If you have labs (blood work) drawn today and your tests are completely normal, you will receive your results only by: [place] (if you have MyChart) OR A paper copy in the mail If you have any lab test that is abnormal or we need to change your treatment, we will call you to review the results.   Testing/Procedures: None Ordered   Follow-Up: At [facility], you and your health needs are our priority.  As part of our continuing mission to provide you with exceptional heart care, we have created designated Provider Care Teams.  These Care Teams include your primary Cardiologist (physician) and Advanced Practice Providers (APPs -  Physician Assistants and Nurse Practitioners) who all work together to provide you with the care you need, when you need it.   Your next appointment:   1 year(s)  The format for your next appointment:   In Person  Provider:   You may see Mertie Moores, MD or one of the following Advanced Practice Providers on your designated Care Team:   Richardson Dopp, PA-C [place], [place]
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
## 2021-12-23 ENCOUNTER — Encounter: Payer: Self-pay | Admitting: Cardiovascular Disease

## 2022-01-20 ENCOUNTER — Telehealth: Payer: Self-pay | Admitting: Cardiovascular Disease

## 2022-01-20 NOTE — Telephone Encounter (Signed)
Call sent straight to triage. Patient complaining of chest pain/pressure that comes and goes with activity or without. Patient stated this has been going on for a month. Patient stated she has also been having complications with her past surgical issues to her abdomen. Patient stated she was bleeding last night from her abdomen and she finally got it stopped. Patient stated she is seeing someone tomorrow at [facility] for that. Informed patient that due to the chest pain, history of CAD and her other symptoms that she should get evaluated in the ED. Patient stated she thinks she will be fine to wait and see the doctor tomorrow at [facility] and he could also evaluate her chest pain. Patient denies any SOB at this time and stated she would go to ED if her symptoms get worse. Patient asked about nitroglycerin. Informed her that a message would be sent to Dr. Acie Fredrickson for his advisement and see if nitro can be ordered. Patient has appointment of Friday with Melina Copa PA that scheduling made.

## 2022-01-20 NOTE — Telephone Encounter (Signed)
Pt c/o of Chest Pain: STAT if CP now or developed within 24 hours  1. Are you having CP right now? A little chest pain at this time- been having pain and tightness in her chest  2. Are you experiencing any other symptoms (ex. SOB, nausea, vomiting, sweating)? Short of breath, not at this time-   3. How long have you been experiencing CP? Very noticeable in the last month  4. Is your CP continuous or coming and going? Comes and goes- sometimes it is worse than other  5. Have you taken Nitroglycerin?  Do not have any ?

## 2022-01-22 NOTE — Progress Notes (Deleted)
Cardiology Office Note    Date:  01/22/2022   ID:  Marea Reasner, DOB Oct 15, 1965, MRN [id number]  PCP:  Neale Burly, MD  Cardiologist:  Mertie Moores, MD  Electrophysiologist:  None   Chief Complaint: ***  History of Present Illness:   Hajra Port is a 56 y.o. female with history of DM, HTN, HLD, mild nonobstructive CAD, anxiety, depression, sleep apnea who is seen for follow-up. She has had chest pain in the past. Remote cath 2008 showed minimal CAD with 30% RCA. Cor CT in 05/2018 showed nonobstructive CAD in the LAD, Cx, and RCA as outlined below (<50%), CAC 61 (94%ile). Echo 04/2018 EF 55-60%, G1DD.  Chronic pain?? Ibuprofen?  Chest pain CAD  Essential HTN Hyperlipidemia  Labwork independently reviewed: 05/2020 Hgb 14.4, plt 135, K 4.5, Cr 0.62, AST 41, ALT 39, LDL 60, trig 165, A1c 7.5  Cardiology Studies:   Studies reviewed are outlined and summarized above. Reports included below if pertinent.   Cor CT 05/2018 EXAM: Cardiac CTA   MEDICATIONS: Sub lingual nitro. '4mg'$  x 2   TECHNIQUE: The patient was scanned on a Siemens 675 slice scanner. Gantry rotation speed was 250 msecs. Collimation was 0.6 mm. A 100 kV prospective scan was triggered in the ascending thoracic aorta at 35-75% of the R-R interval. Average HR during the scan was 60 bpm. The 3D data set was interpreted on a dedicated work station using MPR, MIP and VRT modes. A total of 80cc of contrast was used.   FINDINGS: Non-cardiac: See separate report from [facility].   Pulmonary veins drain normally to the left atrium.   Calcium Score: 61 Agatston units.   Coronary Arteries: Right dominant with no anomalies   LM: No plaque or stenosis.   LAD system: Relatively small caliber LAD with mixed plaque proximally, no more than mild (<50%) stenosis.   Circumflex system: Moderate ramus with mixed plaque, mild (<50%) stenosis proximally. Mixed plaque in the mid LCx with mild  (<50%) stenosis.   RCA system: Mixed plaque with mild stenosis (<50%) in the proximal and distal RCA.   IMPRESSION: 1. Coronary artery calcium score 61 Agatston units. This places the patient in the 94th percentile for age and gender, suggesting high risk for future cardiac events.   2.  Nonobstructive coronary disease.   Dalton Mclean FINDINGS: Limited view of the lung parenchyma demonstrates no suspicious nodularity. Airways are normal.   Limited view of the mediastinum demonstrates no adenopathy. Esophagus normal.   Limited view of the upper abdomen unremarkable.   Limited view of the skeleton and chest wall is unremarkable.   IMPRESSION: No significant extracardiac findings.   Electronically Signed: By: Suzy Bouchard M.D. On: 05/27/2018 08:00   Echo 04/2018   - Left ventricle: The cavity size was normal. Wall thickness was    normal. Systolic function was normal. The estimated ejection    fraction was in the range of 55% to 60%. Wall motion was normal;    there were no regional wall motion abnormalities. Doppler    parameters are consistent with abnormal left ventricular    relaxation (grade 1 diastolic dysfunction).   Impressions:   - Normal LV systolic function; mild diastolic dsyfunction  Cath 2008  PROCEDURES:  1. Right heart catheterization.  2. Left heart catheterization.  3. Coronary angiography.  4. Left ventriculogram.  5. Abdominal aortogram.    COMPLICATIONS:  None.    INDICATIONS:  Ms. Monia Pouch is a 56 year old female patient  of Dr. Delphina Cahill and Dr. Odette Fraction with a history of gastroesophageal reflux  disease, depression, history of recent hospital admission for MRSA  infection of the left face who has continued to complain of chest pain  and shortness of breath.  She has had negative Cardiolites as well as  ultrasounds revealing only mild MR. and mild TR.  Because of her ongoing  symptoms, she is now referred for cardiac  catheterization to rule out  significant CAD.    DESCRIPTION OF OPERATION:  After obtaining informed consent, the patient  was brought to the cardiac cath lab.  The right groin was shaved,  prepped and draped in a sterile fashion.  EEG to monitor status.  Using  modified Seldinger technique, a #7-French venous sheath using the right  femoral vein and a #6-French arterial sheath using the right femoral  artery.  Next, under fluoroscopic guidance, a #7-French Swan-Ganz  catheter was floated into the RA, RV, PA and wedge position and  hemodynamics were obtained.    A #6-French diagnostic catheter was used to perform diagnostic  angiography.    The left main is a large vessel which has no significant disease.    The LAD is a medium-size vessel which becomes a small vessel toward the  apex to one small diagonal branch.  The LAD has no significant disease.    The first diagonal is a small vessel with no significant disease.    The left coronary artery gives rise of a medium to large size ramus  intermedius which bifurcates distally with no significant disease.    Left circumflex is a medium-size vessel coursing in the AV groove and  gives rise to one obtuse marginal branch.  The AV circumflex has no  significant disease.    The first OM is a medium-size vessel which bifurcates distally with no  significant disease.    The right coronary is a large vessel which is dominant consists of a PDA  as well as posterolateral branch.  There is mild 30% ostial narrowing,  but no further significant disease in the RCA, PDA or posterolateral  branch.    Left ventriculogram reveals low normal ejection fraction of 50%.    Abdominal aortogram reveals no significant renal artery stenosis with  normal distal aorta.    HEMODYNAMICS:  Right arterial pressure 10, RV 34/7, PA 34/15, pulmonary  capillary pressures are 15, systemic arterial pressure 742/59, LV  systolic pressure 563/87, LVEDP of 17,  cardiac output 6.9, cardiac index  3.3, PA saturation 61%, AO saturation 95%.    CONCLUSIONS:  1. No significant coronary artery disease.  2. Low normal ejection fraction.  3. No obvious renal artery stenosis.  4. Mild pulmonary hypertension.  5. Cardiac output of 6.9, cardiac index 3.3.  6. PA saturation 61%, AO saturation 95%.        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   ***   Allergies:   Patient has no known allergies.   Social History   Socioeconomic History   Marital status: Single    Spouse name: Not on file   Number of children: 3   Years of education: COLLEGE   Highest education level: Not on file  Occupational History   Occupation: DISABLED  Tobacco Use   Smoking status: Former   Smokeless tobacco: Never  Scientific laboratory technician Use: Never used  Substance and Sexual Activity   Alcohol use: Never   Drug use: Never   Sexual activity: Not on file  Other Topics Concern   Not on file  Social History Narrative   Not on file   Social Determinants of Health   Financial Resource Strain: Not on file  Food Insecurity: Not on file  Transportation Needs: Not on file  Physical Activity: Not on file  Stress: Not on file  Social Connections: Not on file     Family History:  The patient's ***family history includes Colon cancer in her mother; Congestive Heart Failure in her paternal grandmother; Heart Problems in her paternal  grandfather; Heart attack in her father; Hyperlipidemia in her brother; Hypertension in her brother; Hypertension (age of onset: 48) in her paternal grandfather; Prostate cancer in her father.  ROS:   Please see the history of present illness. Otherwise, review of systems is positive for ***.  All other systems are reviewed and otherwise negative.    EKG(s)/Additional Labs   EKG:  EKG is ordered today, personally reviewed, demonstrating ***  Recent Labs: No results found for requested labs within last 365 days.  Recent Lipid Panel    Component Value Date/Time   CHOL 106 01/10/2020 1218   TRIG 185 (H) 01/10/2020 1218   HDL 28 (L) 01/10/2020 1218   CHOLHDL 3.8 01/10/2020 1218   LDLCALC 47 01/10/2020 1218    PHYSICAL EXAM:    VS:  There were no vitals taken for this visit.  BMI: There is no height or weight on file to calculate BMI.  GEN: Well nourished, well developed female in no acute distress HEENT: normocephalic, atraumatic Neck: no JVD, carotid bruits, or masses Cardiac: ***RRR; no murmurs, rubs, or gallops, no edema  Respiratory:  clear to auscultation bilaterally, normal work of breathing GI: soft, nontender, nondistended, + BS MS: no deformity or atrophy Skin: warm and dry, no rash Neuro:  Alert and Oriented x 3, Strength and sensation are intact, follows commands Psych: euthymic mood, full affect  Wt Readings from Last 3 Encounters:  11/06/20 158 lb 12.8 oz (72 kg)  08/30/19 162 lb (73.5 kg)  04/27/18 174 lb 12.8 oz (79.3 kg)     ASSESSMENT & PLAN:   ***     Disposition: F/u with ***   Medication Adjustments/Labs and Tests Ordered: Current medicines are reviewed at length with the patient today.  Concerns regarding medicines are outlined above. Medication changes, Labs and Tests ordered today are summarized above and listed in the Patient Instructions accessible in Encounters.   Signed, Charlie Pitter, PA-C  01/22/2022 10:09 AM    [place] Phone: [phone]; Fax: [phone]

## 2022-01-23 ENCOUNTER — Ambulatory Visit: Payer: Medicare Other | Admitting: Physician Assistant

## 2022-01-23 DIAGNOSIS — I2510 Atherosclerotic heart disease of native coronary artery without angina pectoris: Secondary | ICD-10-CM

## 2022-01-23 DIAGNOSIS — E785 Hyperlipidemia, unspecified: Secondary | ICD-10-CM

## 2022-01-23 DIAGNOSIS — I10 Essential (primary) hypertension: Secondary | ICD-10-CM

## 2022-01-23 DIAGNOSIS — R079 Chest pain, unspecified: Secondary | ICD-10-CM

## 2022-01-26 ENCOUNTER — Ambulatory Visit: Payer: Medicare Other | Admitting: Physician Assistant

## 2022-05-03 ENCOUNTER — Encounter: Payer: Self-pay | Admitting: Cardiovascular Disease

## 2022-05-03 NOTE — Progress Notes (Signed)
This encounter was created in error - please disregard.

## 2022-05-04 ENCOUNTER — Encounter: Payer: Medicare Other | Admitting: Cardiovascular Disease

## 2022-05-20 ENCOUNTER — Ambulatory Visit: Payer: Medicare Other | Admitting: Cardiovascular Disease

## 2022-05-20 ENCOUNTER — Encounter: Payer: Self-pay | Admitting: Cardiovascular Disease

## 2022-05-20 NOTE — Progress Notes (Signed)
This encounter was created in error - please disregard.

## 2022-05-27 ENCOUNTER — Ambulatory Visit: Payer: Medicare Other | Admitting: Cardiovascular Disease

## 2022-05-28 ENCOUNTER — Encounter: Payer: Self-pay | Admitting: Cardiovascular Disease

## 2022-05-28 NOTE — Progress Notes (Signed)
This encounter was created in error - please disregard.

## 2022-05-29 ENCOUNTER — Encounter: Payer: Medicare Other | Admitting: Cardiovascular Disease

## 2022-05-31 ENCOUNTER — Encounter: Payer: Self-pay | Admitting: Cardiovascular Disease

## 2022-05-31 NOTE — Progress Notes (Signed)
This encounter was created in error - please disregard.

## 2022-06-01 ENCOUNTER — Encounter: Payer: Medicare Other | Admitting: Cardiovascular Disease
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
## 2022-06-16 ENCOUNTER — Ambulatory Visit: Payer: Medicare Other | Attending: Cardiovascular Disease | Admitting: Cardiovascular Disease

## 2022-06-16 ENCOUNTER — Encounter: Payer: Self-pay | Admitting: Cardiovascular Disease

## 2022-06-16 VITALS — BP 102/60 | HR 89 | Ht 62.0 in | Wt 170.4 lb

## 2022-06-16 DIAGNOSIS — E782 Mixed hyperlipidemia: Secondary | ICD-10-CM

## 2022-06-16 DIAGNOSIS — R931 Abnormal findings on diagnostic imaging of heart and coronary circulation: Secondary | ICD-10-CM

## 2022-06-16 MED ORDER — LOSARTAN POTASSIUM 25 MG PO TABS: 25.0000 mg | ORAL_TABLET | Freq: Every day | ORAL | 3 refills | Status: AC

## 2022-06-16 NOTE — Patient Instructions (Signed)
Medication Instructions:  STOP Metoprolol Tartrate (Lopressor) STOP Olmesartan (Benicar) START Losartan '25mg'$  daily (Cozaar) *If you need a refill on your cardiac medications before your next appointment, please call your pharmacy*   Lab Work: NONE If you have labs (blood work) drawn today and your tests are completely normal, you will receive your results only by: [place] (if you have MyChart) OR A paper copy in the mail If you have any lab test that is abnormal or we need to change your treatment, we will call you to review the results.   Testing/Procedures: NONE   Follow-Up: At [facility], you and your health needs are our priority.  As part of our continuing mission to provide you with exceptional heart care, we have created designated Provider Care Teams.  These Care Teams include your primary Cardiologist (physician) and Advanced Practice Providers (APPs -  Physician Assistants and Nurse Practitioners) who all work together to provide you with the care you need, when you need it.  We recommend signing up for the patient portal called "MyChart".  Sign up information is provided on this After Visit Summary.  MyChart is used to connect with patients for Virtual Visits (Telemedicine).  Patients are able to view lab/test results, encounter notes, upcoming appointments, etc.  Non-urgent messages can be sent to your provider as well.   To learn more about what you can do with MyChart, go to [email or web address].    Your next appointment:   3 month(s)  Provider:   Mertie Moores, MD
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
## 2022-09-16 ENCOUNTER — Ambulatory Visit: Payer: Medicare Other | Attending: Cardiovascular Disease | Admitting: Cardiovascular Disease

## 2023-05-19 ENCOUNTER — Encounter (INDEPENDENT_AMBULATORY_CARE_PROVIDER_SITE_OTHER): Payer: Self-pay | Admitting: *Deleted

## 2023-09-14 ENCOUNTER — Encounter (INDEPENDENT_AMBULATORY_CARE_PROVIDER_SITE_OTHER): Payer: Self-pay | Admitting: *Deleted

## 2023-10-18 ENCOUNTER — Encounter: Admitting: Adult Health

## 2023-10-18 DIAGNOSIS — Z0289 Encounter for other administrative examinations: Secondary | ICD-10-CM

## 2023-11-16 ENCOUNTER — Encounter (INDEPENDENT_AMBULATORY_CARE_PROVIDER_SITE_OTHER): Payer: Self-pay | Admitting: *Deleted
